# Patient Record
Sex: Male | Born: 1960 | Race: White | Hispanic: No | Marital: Married | Smoking: Never smoker
Health system: Southern US, Community
[De-identification: ages and names within clinical notes are randomized; demographics above are authoritative.]

## PROBLEM LIST (undated history)

## (undated) DIAGNOSIS — C801 Malignant (primary) neoplasm, unspecified: Secondary | ICD-10-CM

## (undated) DIAGNOSIS — F4322 Adjustment disorder with anxiety: Secondary | ICD-10-CM

## (undated) DIAGNOSIS — C9110 Chronic lymphocytic leukemia of B-cell type not having achieved remission: Secondary | ICD-10-CM

## (undated) DIAGNOSIS — E785 Hyperlipidemia, unspecified: Secondary | ICD-10-CM

## (undated) HISTORY — DX: Malignant (primary) neoplasm, unspecified: C80.1

## (undated) HISTORY — PX: BASAL CELL CARCINOMA EXCISION: SHX1214

## (undated) HISTORY — DX: Chronic lymphocytic leukemia of B-cell type not having achieved remission: C91.10

## (undated) HISTORY — PX: LASIK: SHX215

## (undated) HISTORY — DX: Hyperlipidemia, unspecified: E78.5

## (undated) HISTORY — PX: HERNIA REPAIR: SHX51

## (undated) HISTORY — DX: Adjustment disorder with anxiety: F43.22

## (undated) HISTORY — PX: KNEE SURGERY: SHX244

---

## 2005-02-17 ENCOUNTER — Ambulatory Visit: Payer: Self-pay | Admitting: Family Medicine

## 2005-03-06 ENCOUNTER — Ambulatory Visit: Payer: Self-pay | Admitting: Family Medicine

## 2005-03-11 ENCOUNTER — Encounter: Admission: RE | Admit: 2005-03-11 | Discharge: 2005-03-11 | Payer: Self-pay | Admitting: Family Medicine

## 2005-03-17 ENCOUNTER — Ambulatory Visit (HOSPITAL_COMMUNITY): Admission: RE | Admit: 2005-03-17 | Discharge: 2005-03-17 | Payer: Self-pay | Admitting: Family Medicine

## 2005-03-17 IMAGING — NM NM HEPATO W/GB/PHARM/[PERSON_NAME]
1 series · 6 of 6 positions shown · non-contrast
Comparison: None.

CLINICAL DATA: Abdominal pain.
 NUCLEAR MEDICINE HEPATOBILIARY SCAN WITH EJECTION FRACTION:
TECHNIQUE: Routine imaging out to one hour following 5 millicuries of technetium-Choletec.  1.95 micrograms of CCK used to stimulate the gallbladder.

[Series 1: he hepatobiliary · 3.22mm/px · 6 of 60 frames shown]
[frame 6/60]
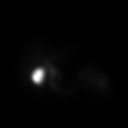
[frame 16/60]
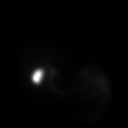
[frame 26/60]
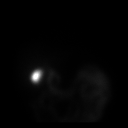
[frame 36/60]
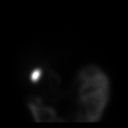
[frame 46/60]
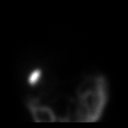
[frame 56/60]
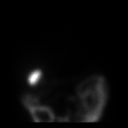

[6 of 6 positions shown; findings below may reference images not displayed]

FINDINGS: There is prompt visualization of the gallbladder, biliary tree, and small bowel.
 The ejection fraction is calculated at 61 percent at 30 minutes, which is normal.
IMPRESSION: 1.  Patent cystic and common bile ducts.
 2.  Normal ejection fraction.

## 2005-03-18 ENCOUNTER — Ambulatory Visit: Payer: Self-pay | Admitting: Family Medicine

## 2005-03-20 ENCOUNTER — Ambulatory Visit: Payer: Self-pay | Admitting: Family Medicine

## 2006-08-18 ENCOUNTER — Ambulatory Visit: Payer: Self-pay | Admitting: Family Medicine

## 2006-08-25 ENCOUNTER — Ambulatory Visit: Payer: Self-pay | Admitting: Family Medicine

## 2006-12-01 ENCOUNTER — Ambulatory Visit: Payer: Self-pay | Admitting: Family Medicine

## 2006-12-01 LAB — CONVERTED CEMR LAB
Chol/HDL Ratio, serum: 4.8
Cholesterol: 235 mg/dL (ref 0–200)
HDL: 48.8 mg/dL (ref >39.0)
Triglyceride fasting, serum: 46 mg/dL (ref 0–149)
VLDL: 9 mg/dL (ref 0–40)

## 2007-03-02 ENCOUNTER — Ambulatory Visit: Payer: Self-pay | Admitting: Family Medicine

## 2007-03-02 LAB — CONVERTED CEMR LAB
AST: 29 units/L (ref 0–37)
Albumin: 3.9 g/dL (ref 3.5–5.2)
Alkaline Phosphatase: 51 units/L (ref 39–117)
Total Bilirubin: 0.9 mg/dL (ref 0.3–1.2)
Total Protein: 6.1 g/dL (ref 6.0–8.3)
VLDL: 9 mg/dL (ref 0–40)

## 2007-08-20 ENCOUNTER — Ambulatory Visit: Payer: Self-pay | Admitting: Family Medicine

## 2007-08-20 DIAGNOSIS — F4322 Adjustment disorder with anxiety: Secondary | ICD-10-CM

## 2007-08-20 DIAGNOSIS — E785 Hyperlipidemia, unspecified: Secondary | ICD-10-CM

## 2007-08-20 HISTORY — DX: Adjustment disorder with anxiety: F43.22

## 2007-08-20 HISTORY — DX: Hyperlipidemia, unspecified: E78.5

## 2007-09-22 ENCOUNTER — Ambulatory Visit: Payer: Self-pay | Admitting: Family Medicine

## 2007-09-22 LAB — CONVERTED CEMR LAB
Albumin: 4.1 g/dL (ref 3.5–5.2)
BUN: 12 mg/dL (ref 6–23)
Basophils Relative: 0.5 % (ref 0.0–1.0)
Bilirubin, Direct: 0.2 mg/dL (ref 0.0–0.3)
CO2: 29 meq/L (ref 19–32)
Calcium: 9.4 mg/dL (ref 8.4–10.5)
Creatinine, Ser: 1.2 mg/dL (ref 0.4–1.5)
Eosinophils Absolute: 0.2 10*3/uL (ref 0.0–0.6)
Eosinophils Relative: 3.6 % (ref 0.0–5.0)
HCT: 45.2 % (ref 39.0–52.0)
HDL: 50.5 mg/dL (ref >39.0)
Hemoglobin: 15.6 g/dL (ref 13.0–17.0)
MCV: 89.2 fL (ref 78.0–100.0)
Sodium: 139 meq/L (ref 135–145)
TSH: 2.84 microintl units/mL (ref 0.35–5.50)
Total Bilirubin: 1 mg/dL (ref 0.3–1.2)
Total Protein: 6.3 g/dL (ref 6.0–8.3)
Triglycerides: 49 mg/dL (ref 0–149)
VLDL: 10 mg/dL (ref 0–40)
WBC: 5.5 10*3/uL (ref 4.5–10.5)

## 2007-10-04 ENCOUNTER — Ambulatory Visit: Payer: Self-pay | Admitting: Family Medicine

## 2008-10-11 ENCOUNTER — Ambulatory Visit: Payer: Self-pay | Admitting: Family Medicine

## 2008-10-11 LAB — CONVERTED CEMR LAB
ALT: 29 units/L (ref 0–53)
AST: 23 units/L (ref 0–37)
Albumin: 3.9 g/dL (ref 3.5–5.2)
Basophils Relative: 0.6 % (ref 0.0–3.0)
Chloride: 104 meq/L (ref 96–112)
Creatinine, Ser: 1.1 mg/dL (ref 0.4–1.5)
Eosinophils Absolute: 0.2 10*3/uL (ref 0.0–0.7)
GFR calc Af Amer: 93 mL/min
Hemoglobin: 15.5 g/dL (ref 13.0–17.0)
Lymphocytes Relative: 31.8 % (ref 12.0–46.0)
MCHC: 34.9 g/dL (ref 30.0–36.0)
MCV: 88.6 fL (ref 78.0–100.0)
Monocytes Absolute: 0.3 10*3/uL (ref 0.1–1.0)
Potassium: 4.4 meq/L (ref 3.5–5.1)
RBC: 5.02 M/uL (ref 4.22–5.81)
Sodium: 141 meq/L (ref 135–145)
Total Protein: 6.5 g/dL (ref 6.0–8.3)
Triglycerides: 55 mg/dL (ref 0–149)

## 2008-10-30 ENCOUNTER — Ambulatory Visit: Payer: Self-pay | Admitting: Family Medicine

## 2009-11-28 ENCOUNTER — Ambulatory Visit: Payer: Self-pay | Admitting: Family Medicine

## 2009-11-28 LAB — CONVERTED CEMR LAB
ALT: 29 units/L (ref 0–53)
Albumin: 3.9 g/dL (ref 3.5–5.2)
Alkaline Phosphatase: 39 units/L (ref 39–117)
Basophils Relative: 0.5 % (ref 0.0–3.0)
Bilirubin Urine: NEGATIVE
Blood in Urine, dipstick: NEGATIVE
Calcium: 9.5 mg/dL (ref 8.4–10.5)
Cholesterol: 263 mg/dL — ABNORMAL HIGH (ref 0–200)
Creatinine, Ser: 1.1 mg/dL (ref 0.4–1.5)
Eosinophils Absolute: 0.2 10*3/uL (ref 0.0–0.7)
Glucose, Bld: 109 mg/dL — ABNORMAL HIGH (ref 70–99)
Glucose, Urine, Semiquant: NEGATIVE
Hemoglobin: 15.2 g/dL (ref 13.0–17.0)
Ketones, urine, test strip: NEGATIVE
Lymphocytes Relative: 33.9 % (ref 12.0–46.0)
Lymphs Abs: 1.9 10*3/uL (ref 0.7–4.0)
Monocytes Relative: 4.7 % (ref 3.0–12.0)
Nitrite: NEGATIVE
Protein, U semiquant: NEGATIVE
RBC: 5.09 M/uL (ref 4.22–5.81)
RDW: 13.2 % (ref 11.5–14.6)
Specific Gravity, Urine: 1.02
TSH: 1.6 microintl units/mL (ref 0.35–5.50)
Total Bilirubin: 1.2 mg/dL (ref 0.3–1.2)
Total CHOL/HDL Ratio: 5
Urobilinogen, UA: 0.2
WBC Urine, dipstick: NEGATIVE
WBC: 5.5 10*3/uL (ref 4.5–10.5)
pH: 5.5

## 2009-12-06 ENCOUNTER — Ambulatory Visit: Payer: Self-pay | Admitting: Family Medicine

## 2010-01-28 ENCOUNTER — Encounter (INDEPENDENT_AMBULATORY_CARE_PROVIDER_SITE_OTHER): Payer: Self-pay | Admitting: *Deleted

## 2010-01-30 ENCOUNTER — Ambulatory Visit: Payer: Self-pay | Admitting: Internal Medicine

## 2010-02-28 ENCOUNTER — Ambulatory Visit: Payer: Self-pay | Admitting: Internal Medicine

## 2010-12-24 NOTE — Assessment & Plan Note (Signed)
Summary: cpx/cjr   Vital Signs:  Patient profile:   50 year old male Height:      73.75 inches Weight:      231 pounds BMI:     29.97 Pulse rate:   88 / minute BP sitting:   122 / 74  (left arm)  Vitals Entered By: Doristine Devoid (December 06, 2009 4:04 PM) CC: CPX   CC:  CPX.  History of Present Illness: Jesse [name] is a 50 year old, married male, nonsmoker, who comes in today for evaluation of depression, hyperlipidemia.  His always been in excellent health.  He said no chronic health problems except for the above.  He takes Celexa 20 mg nightly for mild anxiety sleeping well functioning well.  No side effects from medication wishes to continue.  He takes simvastatin 20 mg nightly along with an aspirin tablet total cholesterol 263, HDL 55, LDL 195.  Triglycerides 62, a week prior to his lab work.  He ran out of his medicine for one week.  He gets routine eye care every two years.  He has had lasik eye surgery.  He gets routine dental care.  His mother had a history of colon polyps.  His grandmother had a history of colon cancer.  Will recommend a colonoscopy at age 59.  Allergies: No Known Drug Allergies  Past History:  Past medical, surgical, family and social histories (including risk factors) reviewed, and no changes noted (except as noted below).  Past Medical History: Reviewed history from 10/04/2007 and no changes required. Hyperlipidemia Lasix eye surgery.  Successful 2008 no complications right inguinal hernia repair with mesh  Family History: Reviewed history from 10/04/2007 and no changes required. Family History of Alcoholism/Addiction Family History Breast cancer 1st degree relative <50 Family History Depression Sister bipolar depression  mother has colon polyps, grandmother colon cancer  Social History: Reviewed history from 10/04/2007 and no changes required. Occupation: Married Never Smoked Alcohol use-no Drug use-no Regular exercise-yes  Review of  Systems      See HPI  Physical Exam  General:  Well-developed,well-nourished,in no acute distress; alert,appropriate and cooperative throughout examination Head:  Normocephalic and atraumatic without obvious abnormalities. No apparent alopecia or balding. Eyes:  No corneal or conjunctival inflammation noted. EOMI. Perrla. Funduscopic exam benign, without hemorrhages, exudates or papilledema. Vision grossly normal. Ears:  External ear exam shows no significant lesions or deformities.  Otoscopic examination reveals clear canals, tympanic membranes are intact bilaterally without bulging, retraction, inflammation or discharge. Hearing is grossly normal bilaterally. Nose:  External nasal examination shows no deformity or inflammation. Nasal mucosa are pink and moist without lesions or exudates. Mouth:  Oral mucosa and oropharynx without lesions or exudates.  Teeth in good repair. Neck:  No deformities, masses, or tenderness noted. Chest Wall:  No deformities, masses, tenderness or gynecomastia noted. Breasts:  No masses or gynecomastia noted Lungs:  Normal respiratory effort, chest expands symmetrically. Lungs are clear to auscultation, no crackles or wheezes. Heart:  Normal rate and regular rhythm. S1 and S2 normal without gallop, murmur, click, rub or other extra sounds. Abdomen:  Bowel sounds positive,abdomen soft and non-tender without masses, organomegaly or hernias noted. Rectal:  No external abnormalities noted. Normal sphincter tone. No rectal masses or tenderness. Genitalia:  Testes bilaterally descended without nodularity, tenderness or masses. No scrotal masses or lesions. No penis lesions or urethral discharge. Prostate:  Prostate gland firm and smooth, no enlargement, nodularity, tenderness, mass, asymmetry or induration. Msk:  No deformity or scoliosis noted of thoracic or  lumbar spine.   Pulses:  R and L carotid,radial,femoral,dorsalis pedis and posterior tibial pulses are full and  equal bilaterally Extremities:  No clubbing, cyanosis, edema, or deformity noted with normal full range of motion of all joints.   Neurologic:  No cranial nerve deficits noted. Station and gait are normal. Plantar reflexes are down-going bilaterally. DTRs are symmetrical throughout. Sensory, motor and coordinative functions appear intact. Skin:  Intact without suspicious lesions or rashes Cervical Nodes:  No lymphadenopathy noted Axillary Nodes:  No palpable lymphadenopathy Inguinal Nodes:  No significant adenopathy Psych:  Cognition and judgment appear intact. Alert and cooperative with normal attention span and concentration. 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 At bedtime  Orders: Prescription Created Electronically [id number])  Problem # 3:  Preventive Health Care (ICD-V70.0) Assessment: Unchanged  Complete Medication List: 1)  Simvastatin 20 Mg Tabs (Simvastatin) .... At bedtime 2)  Celexa 20 Mg Tabs (Citalopram hydrobromide) .Marland Kitchen.. 1 tab @ bedtime 3)  Asa   Other Orders: EKG w/ Interpretation (93000) Gastroenterology Referral (GI)  Patient Instructions: 1)  Please schedule a follow-up appointment in 1 year. 2)  It is important that you exercise regularly at least 20 minutes 5 times a week. If you develop chest pain, have severe difficulty breathing, or feel very tired , stop exercising immediately and seek medical attention. 3)  Schedule a colonoscopy/sigmoidoscopy to help detect colon cancer. 4)  Take an Aspirin every day. Prescriptions: CELEXA 20 MG  TABS (CITALOPRAM HYDROBROMIDE) 1 tab @ bedtime  #100 x 3   Entered and  Authorized by:   Roderick Pee MD   Signed by:   Roderick Pee MD on 12/06/2009   Method used:   [facility]        [facility] [address] [phone]* (retail)       [address]       [place], [place]  [address]       Ph: [phone] or [phone]       Fax: [phone]   RxID:   [id number] SIMVASTATIN 20 MG  TABS (SIMVASTATIN) at bedtime  #100 x 3   Entered and Authorized by:   Roderick Pee MD   Signed by:   Roderick Pee MD on 12/06/2009   Method used:   [facility]ron[facility] to        [facility] [address] [phone]* (retail)       [address]       [place], [place]  [address]       Ph: [phone] or [phone]       Fax: [phone]   RxID:   [id number]

## 2010-12-24 NOTE — Miscellaneous (Signed)
Summary: [facility] Previsit/prep  Clinical Lists Changes  Medications: Added new medication of MIRALAX   POWD (POLYETHYLENE GLYCOL 3350) As per prep  instructions. - Signed Added new medication of METOCLOPRAMIDE HCL 10 MG  TABS (METOCLOPRAMIDE HCL) As per prep instructions. - Signed Added new medication of DULCOLAX 5 MG  TBEC (BISACODYL) Day before procedure take 2 at 3pm and 2 at 8pm. - Signed Rx of MIRALAX   POWD (POLYETHYLENE GLYCOL 3350) As per prep  instructions.;  #255gm x 0;  Signed;  Entered by: [name] RN;  Authorized by: Hart Carwin MD;  Method used: [facility]ically to [facility] [address] [phone]*, [address], [place], [place], [place]  [address], Ph: [phone] or [phone], Fax: [phone] Rx of METOCLOPRAMIDE HCL 10 MG  TABS (METOCLOPRAMIDE HCL) As per prep instructions.;  #2 x 0;  Signed;  Entered by: [name] RN;  Authorized by: Hart Carwin MD;  Method used: Electronically to [facility] [address] [phone]*, [address] [place], [place], [place]  [address], Ph: [phone] or [phone], Fax: [phone] Rx of DULCOLAX 5 MG  TBEC (BISACODYL) Day before procedure take 2 at 3pm and 2 at 8pm.;  #4 x 0;  Signed;  Entered by: [name] RN;  Authorized by: Hart Carwin MD;  Method used: Electronically to [facility] [address] [phone]*, [address] [place], [place], [place]  [address], Ph: [phone] or [phone], Fax: [phone] Observations: Added new observation of NKA: T (01/30/2010 10:35)    Prescriptions: DULCOLAX 5 MG  TBEC (BISACODYL) Day before procedure take 2 at 3pm and 2 at 8pm.  #4 x 0   Entered by:   [name] RN   Authorized by:   Hart Carwin MD   Signed by:   [name] RN on 01/30/2010   Method used:   Electronically to        [facility] [address] [phone]* (retail)       [address]       [place], [place]  [address]       Ph: [phone] or [phone]       Fax:  [phone]   RxID:   [id number] METOCLOPRAMIDE HCL 10 MG  TABS (METOCLOPRAMIDE HCL) As per prep instructions.  #2 x 0   Entered by:   [name] RN   Authorized by:   Hart Carwin MD   Signed by:   [name] RN on 01/30/2010   Method used:   Electronically to        [facility] [address] [phone]* (retail)       [address]       [place], [place]  [address]       Ph: [phone] or [phone]       Fax: [phone]   RxID:   [id number] MIRALAX   POWD (POLYETHYLENE GLYCOL 3350) As per prep  instructions.  #255gm x 0   Entered by:   [name] RN   Authorized by:   Hart Carwin MD   Signed by:   [name] RN on 01/30/2010   Method used:   Electronically to        [facility] [address] [phone]* (retail)       [address]       [place], [place]  [address]       Ph: [phone] or [phone]       Fax: [phone]   RxID:   [id number]

## 2010-12-24 NOTE — Procedures (Signed)
Summary: Colonoscopy  Patient: [name] Note: All result statuses are Final unless otherwise noted.  Tests: (1) Colonoscopy (COL)   COL Colonoscopy           DONE     [facility]     [address]     [place], [place]  [address]           COLONOSCOPY PROCEDURE REPORT           PATIENT:  [name], Jesse  MR#:  [id number]     BIRTHDATE:  1961-08-24, 49 yrs. old  GENDER:  male     ENDOSCOPIST:  Hedwig Morton. Juanda Chance, MD     REF. BY:  Tinnie Gens A. Tawanna Cooler, M.D.     PROCEDURE DATE:  02/28/2010     PROCEDURE:  Colonoscopy [address]     ASA CLASS:  Class I     INDICATIONS:  Routine Risk Screening grandfather with colon cancer           parent with colon polyps     MEDICATIONS:   Versed 7 mg, Fentanyl 75 mcg           DESCRIPTION OF PROCEDURE:   After the risks benefits and     alternatives of the procedure were thoroughly explained, informed     consent was obtained.  Digital rectal exam was performed and     revealed no rectal masses.   The LB [id number] endoscope     was introduced through the anus and advanced to the cecum, which     was identified by both the appendix and ileocecal valve, without     limitations.  The quality of the prep was good, using MiraLax.     The instrument was then slowly withdrawn as the colon was fully     examined.     <<PROCEDUREIMAGES>>           FINDINGS:  Mild diverticulosis was found in the sigmoid colon (see     image1).  This was otherwise a normal examination of the colon     (see image5, image4, image3, and image2).  Internal hemorrhoids     were found (see image7 and image6).   Retroflexed views in the     rectum revealed no abnormalities.    The scope was then withdrawn     from the patient and the procedure completed.           COMPLICATIONS:  None     ENDOSCOPIC IMPRESSION:     1) Mild diverticulosis in the sigmoid colon     2) Otherwise normal examination     3) Internal hemorrhoids     RECOMMENDATIONS:     1) high fiber diet     REPEAT EXAM:  In 7 - 10 year(s) for.           ______________________________     Hedwig Morton. Juanda Chance, MD           CC:           n.     eSIGNED:   Hedwig Morton. Brodie at 02/28/2010 08:51 AM           Jesse [name], [phone]  Note: An exclamation mark (!) indicates a result that was not dispersed into the flowsheet. Document Creation Date: 02/28/2010 8:52 AM _______________________________________________________________________  (1) Order result status: Final Collection or observation date-time: 02/28/2010 08:45 Requested date-time:  Receipt date-time:  Reported date-time:  Referring Physician:   Ordering Physician:  Lina Sar ([id number]) Specimen Source:  Source: Launa Grill Order Number: [id number] Lab site:   Appended Document: Colonoscopy    Clinical Lists Changes  Observations: Added new observation of COLONNXTDUE: 02/2017 (02/28/2010 13:29)

## 2010-12-24 NOTE — Letter (Signed)
Summary: [name] Instructions  [facility]  [address] [place], [place] [address]   Phone: [phone]  Fax: [phone]       Jesse [name]    02-04-1961    MRN: [id number]       Procedure Day Jesse [name]:  [facility]  02/20/10     Arrival Time: 8:00AM     Procedure Time: 9:00AM     Location of Procedure:                    Jesse [name] _  [facility] (4th Floor)     PREPARATION FOR COLONOSCOPY WITH MIRALAX  Starting 5 days prior to your procedure 02/15/10 do not eat nuts, seeds, popcorn, corn, beans, peas,  salads, or any raw vegetables.  Do not take any fiber supplements (e.g. Metamucil, Citrucel, and Benefiber). ____________________________________________________________________________________________________   THE DAY BEFORE YOUR PROCEDURE         DATE: 02/19/10 DAY: TUESDAY  1   Drink clear liquids the entire day-NO SOLID FOOD  2   Do not drink anything colored red or purple.  Avoid juices with pulp.  No orange juice.  3   Drink at least 64 oz. (8 glasses) of fluid/clear liquids during the day to prevent dehydration and help the prep work efficiently.  CLEAR LIQUIDS INCLUDE: Water Jello Ice Popsicles Tea (sugar ok, no milk/cream) Powdered fruit flavored drinks Coffee (sugar ok, no milk/cream) Gatorade Juice: apple, white grape, white cranberry  Lemonade Clear bullion, consomm, broth Carbonated beverages (any kind) Strained chicken noodle soup Hard Candy  4   Mix the entire bottle of Miralax with 64 oz. of Gatorade/Powerade in the morning and put in the refrigerator to chill.  5   At 3:00 pm take 2 Dulcolax/Bisacodyl tablets.  6   At 4:30 pm take one Reglan/Metoclopramide tablet.  7  Starting at 5:00 pm drink one 8 oz glass of the Miralax mixture every 15-20 minutes until you have finished drinking the entire 64 oz.  You should finish drinking prep around 7:30 or 8:00 pm.  8   If you are nauseated, you may take the 2nd Reglan/Metoclopramide  tablet at 6:30 pm.        9    At 8:00 pm take 2 more DULCOLAX/Bisacodyl tablets.     THE DAY OF YOUR PROCEDURE      DATE:  02/20/10 DAY: Jesse [name]  You may drink clear liquids until 7:00AM (2 HOURS BEFORE PROCEDURE).   MEDICATION INSTRUCTIONS  Unless otherwise instructed, you should take regular prescription medications with a small sip of water as early as possible the morning of your procedure.            OTHER INSTRUCTIONS  You will need a responsible adult at least 48 years of age to accompany you and drive you home.   This person must remain in the waiting room during your procedure.  Wear loose fitting clothing that is easily removed.  Leave jewelry and other valuables at home.  However, you may wish to bring a book to read or an iPod/MP3 player to listen to music as you wait for your procedure to start.  Remove all body piercing jewelry and leave at home.  Total time from sign-in until discharge is approximately 2-3 hours.  You should go home directly after your procedure and rest.  You can resume normal activities the day after your procedure.  The day of your procedure you should not:   Drive  Make legal decisions   Operate machinery   Drink alcohol   Return to work  You will receive specific instructions about eating, activities and medications before you leave.   The above instructions have been reviewed and explained to me by  Jesse Almas RN  January 30, 2010 10:59 AM     I fully understand and can verbalize these instructions _____________________________ Date _______

## 2011-02-25 ENCOUNTER — Other Ambulatory Visit: Payer: Self-pay | Admitting: Family Medicine

## 2011-06-09 ENCOUNTER — Other Ambulatory Visit: Payer: Self-pay | Admitting: Family Medicine

## 2011-07-10 ENCOUNTER — Other Ambulatory Visit: Payer: Self-pay | Admitting: Family Medicine

## 2011-11-30 ENCOUNTER — Other Ambulatory Visit: Payer: Self-pay | Admitting: Family Medicine

## 2011-12-04 ENCOUNTER — Other Ambulatory Visit (INDEPENDENT_AMBULATORY_CARE_PROVIDER_SITE_OTHER): Payer: 59

## 2011-12-04 DIAGNOSIS — Z0000 Encounter for general adult medical examination without abnormal findings: Secondary | ICD-10-CM

## 2011-12-04 LAB — POCT URINALYSIS DIPSTICK
Bilirubin, UA: NEGATIVE
Glucose, UA: NEGATIVE
Ketones, UA: NEGATIVE
Protein, UA: NEGATIVE
Spec Grav, UA: 1.025
Urobilinogen, UA: 0.2
pH, UA: 5

## 2011-12-04 LAB — CBC WITH DIFFERENTIAL/PLATELET
Basophils Absolute: 0 10*3/uL (ref 0.0–0.1)
Basophils Relative: 0.5 % (ref 0.0–3.0)
Eosinophils Absolute: 0.2 10*3/uL (ref 0.0–0.7)
HCT: 43.3 % (ref 39.0–52.0)
Hemoglobin: 14.8 g/dL (ref 13.0–17.0)
Lymphocytes Relative: 34.2 % (ref 12.0–46.0)
MCHC: 34.3 g/dL (ref 30.0–36.0)
MCV: 88.9 fl (ref 78.0–100.0)
Neutrophils Relative %: 56.3 % (ref 43.0–77.0)
Platelets: 184 10*3/uL (ref 150.0–400.0)
RDW: 13.6 % (ref 11.5–14.6)
WBC: 6.9 10*3/uL (ref 4.5–10.5)

## 2011-12-04 LAB — TSH: TSH: 2.87 u[IU]/mL (ref 0.35–5.50)

## 2011-12-04 LAB — BASIC METABOLIC PANEL: Creatinine, Ser: 1.2 mg/dL (ref 0.4–1.5)

## 2011-12-04 LAB — HEPATIC FUNCTION PANEL
Alkaline Phosphatase: 54 U/L (ref 39–117)
Total Bilirubin: 0.7 mg/dL (ref 0.3–1.2)
Total Protein: 6.6 g/dL (ref 6.0–8.3)

## 2011-12-12 ENCOUNTER — Encounter: Payer: Self-pay | Admitting: Family Medicine

## 2011-12-15 ENCOUNTER — Encounter: Payer: Self-pay | Admitting: Family Medicine

## 2011-12-15 ENCOUNTER — Ambulatory Visit (INDEPENDENT_AMBULATORY_CARE_PROVIDER_SITE_OTHER): Payer: 59 | Admitting: Family Medicine

## 2011-12-15 DIAGNOSIS — F4322 Adjustment disorder with anxiety: Secondary | ICD-10-CM

## 2011-12-15 DIAGNOSIS — Z23 Encounter for immunization: Secondary | ICD-10-CM

## 2011-12-15 DIAGNOSIS — E785 Hyperlipidemia, unspecified: Secondary | ICD-10-CM

## 2011-12-15 MED ORDER — CITALOPRAM HYDROBROMIDE 20 MG PO TABS: 20.0000 mg | ORAL_TABLET | ORAL | Status: DC

## 2011-12-15 MED ORDER — SIMVASTATIN 20 MG PO TABS: 20.0000 mg | ORAL_TABLET | Freq: Every day | ORAL | Status: DC

## 2011-12-15 NOTE — Progress Notes (Signed)
  Subjective:    Patient ID: Jesse [name], male    DOB: 07/01/1961, 51 y.o.   MRN: [id number]  HPI Jesse [name] is a 51 year old, married male, nonsmoker, who comes in today for evaluation of mild anxiety, and hyperlipidemia.  He takes Celexa 20 mg a day at bedtime for mild anxiety.  Also, on aspirin tablet and simvastatin 20 mg nightly for hyperlipidemia.  The children are 8..9 and 10 at home.  He also has his father is 57 years old now living with him  He has two lesions, one on the right side of his nose.  Another on the left cheek that are increasing in size   Review of Systems  Constitutional: Negative.   HENT: Negative.   Eyes: Negative.   Respiratory: Negative.   Cardiovascular: Negative.   Gastrointestinal: Negative.   Genitourinary: Negative.   Musculoskeletal: Negative.   Skin: Negative.   Neurological: Negative.   Hematological: Negative.   Psychiatric/Behavioral: Negative.        Objective:   Physical Exam  Constitutional: He is oriented to person, place, and time. He appears well-developed and well-nourished.  HENT:  Head: Normocephalic and atraumatic.  Right Ear: External ear normal.  Left Ear: External ear normal.  Nose: Nose normal.  Mouth/Throat: Oropharynx is clear and moist.  Eyes: Conjunctivae and EOM are normal. Pupils are equal, round, and reactive to light.  Neck: Normal range of motion. Neck supple. No JVD present. No tracheal deviation present. No thyromegaly present.  Cardiovascular: Normal rate, regular rhythm, normal heart sounds and intact distal pulses.  Exam reveals no gallop and no friction rub.   No murmur heard. Pulmonary/Chest: Effort normal and breath sounds normal. No stridor. No respiratory distress. He has no wheezes. He has no rales. He exhibits no tenderness.  Abdominal: Soft. Bowel sounds are normal. He exhibits no distension and no mass. There is no tenderness. There is no rebound and no guarding.  Genitourinary: Rectum normal, prostate  normal and penis normal. Guaiac negative stool. No penile tenderness.  Musculoskeletal: Normal range of motion. He exhibits no edema and no tenderness.  Lymphadenopathy:    He has no cervical adenopathy.  Neurological: He is alert and oriented to person, place, and time. He has normal reflexes. No cranial nerve deficit. He exhibits normal muscle tone.  Skin: Skin is warm and dry. No rash noted. No erythema. No pallor.       Total body skin exam normal except for a elevated lesion right side of the nose, and a flat dark lesion, left cheek advised to return for removal  Psychiatric: He has a normal mood and affect. His behavior is normal. Judgment and thought content normal.          Assessment & Plan:  Healthy male.  Mild anxiety.  Continue Celexa 20 mg nightly  Hyperlipidemia.  Continue Zocor 20 mg daily and an aspirin tablet.  Two abnormal appearing moles.  Return for removal

## 2011-12-15 NOTE — Patient Instructions (Signed)
Continue your current medications.  Set up a 30 minute appointment sometime in the next couple weeks to remove the two lesions we discussed

## 2011-12-31 ENCOUNTER — Ambulatory Visit: Payer: 59 | Admitting: Family Medicine

## 2012-01-06 ENCOUNTER — Ambulatory Visit: Payer: 59 | Admitting: Family Medicine

## 2012-01-15 ENCOUNTER — Encounter: Payer: Self-pay | Admitting: Family Medicine

## 2012-01-15 ENCOUNTER — Ambulatory Visit (INDEPENDENT_AMBULATORY_CARE_PROVIDER_SITE_OTHER): Payer: 59 | Admitting: Family Medicine

## 2012-01-15 DIAGNOSIS — C44319 Basal cell carcinoma of skin of other parts of face: Secondary | ICD-10-CM

## 2012-01-15 DIAGNOSIS — L989 Disorder of the skin and subcutaneous tissue, unspecified: Secondary | ICD-10-CM

## 2012-01-15 DIAGNOSIS — D2330 Other benign neoplasm of skin of unspecified part of face: Secondary | ICD-10-CM

## 2012-01-15 DIAGNOSIS — C44310 Basal cell carcinoma of skin of unspecified parts of face: Secondary | ICD-10-CM | POA: Insufficient documentation

## 2012-01-15 DIAGNOSIS — D235 Other benign neoplasm of skin of trunk: Secondary | ICD-10-CM

## 2012-01-15 DIAGNOSIS — D225 Melanocytic nevi of trunk: Secondary | ICD-10-CM

## 2012-01-15 DIAGNOSIS — L820 Inflamed seborrheic keratosis: Secondary | ICD-10-CM | POA: Insufficient documentation

## 2012-01-15 DIAGNOSIS — D2230 Melanocytic nevi of unspecified part of face: Secondary | ICD-10-CM

## 2012-01-15 NOTE — Patient Instructions (Signed)
We will call you within 2 weeks with the path report 

## 2012-01-15 NOTE — Progress Notes (Signed)
  Subjective:    Patient ID: Jesse [name], male    DOB: [birth year], 51 y.o.   MRN: [id number]  HPI Jesse [name] is a 51 year old married male nonsmoker who comes in today for removal of 3 lesions  Lesion #1,,,,,,,, left face 10 mm x 10 mm appears to be an inflamed seborrheic keratosis  Lesion #2,,,,,,,,, 5 mm x 5 mm in the nasal fold,,,,,,,,,, question DCE  Lesion #3,,,,,,,, 5 mm x 5 mm posterior back midline T8....... Question bce   Review of Systems    review of systems negative except she has light skin and light eyes and has a lot of sun damage in the past Objective:   Physical Exam Physical examination as above  Procedure,,,,,,,, after informed consent all 3 lesions were anesthetized with 1% Xylocaine with epinephrine. They were excised with 2 mm margins. The bases were cauterized Band-Aids were applied. The lesions were sent for pathologic analysis. Dry sterile dressings were applied. He tolerated the procedure no complication      Assessment & Plan:  Inflamed seborrheic keratosis left face  Lesion in the fold of the right nose question basal cell carcinoma  Lesion midline back question basal cell carcinoma

## 2012-01-19 DIAGNOSIS — D2230 Melanocytic nevi of unspecified part of face: Secondary | ICD-10-CM | POA: Insufficient documentation

## 2012-01-19 DIAGNOSIS — D225 Melanocytic nevi of trunk: Secondary | ICD-10-CM | POA: Insufficient documentation

## 2012-01-21 ENCOUNTER — Telehealth: Payer: Self-pay | Admitting: Family Medicine

## 2012-01-21 NOTE — Telephone Encounter (Signed)
Pt is calling back requesting biopsy results

## 2012-01-22 NOTE — Telephone Encounter (Signed)
Spoke with patient.

## 2012-08-17 ENCOUNTER — Other Ambulatory Visit: Payer: Self-pay | Admitting: Family Medicine

## 2012-08-30 ENCOUNTER — Other Ambulatory Visit: Payer: Self-pay | Admitting: Family Medicine

## 2012-09-21 ENCOUNTER — Other Ambulatory Visit: Payer: Self-pay | Admitting: Family Medicine

## 2012-12-08 ENCOUNTER — Other Ambulatory Visit (INDEPENDENT_AMBULATORY_CARE_PROVIDER_SITE_OTHER): Payer: 59

## 2012-12-08 DIAGNOSIS — Z0000 Encounter for general adult medical examination without abnormal findings: Secondary | ICD-10-CM

## 2012-12-08 LAB — BASIC METABOLIC PANEL
BUN: 20 mg/dL (ref 6–23)
CO2: 29 mEq/L (ref 19–32)
Calcium: 9.3 mg/dL (ref 8.4–10.5)
Chloride: 103 mEq/L (ref 96–112)
GFR: 65.6 mL/min (ref >60.00)
Glucose, Bld: 103 mg/dL — ABNORMAL HIGH (ref 70–99)

## 2012-12-08 LAB — LIPID PANEL
Cholesterol: 195 mg/dL (ref 0–200)
LDL Cholesterol: 131 mg/dL — ABNORMAL HIGH (ref 0–99)
Triglycerides: 71 mg/dL (ref 0.0–149.0)

## 2012-12-08 LAB — CBC WITH DIFFERENTIAL/PLATELET
Basophils Relative: 0.4 % (ref 0.0–3.0)
Lymphocytes Relative: 24.5 % (ref 12.0–46.0)
MCV: 86.4 fl (ref 78.0–100.0)
Monocytes Relative: 8.2 % (ref 3.0–12.0)
Neutro Abs: 4.7 10*3/uL (ref 1.4–7.7)
Neutrophils Relative %: 63.7 % (ref 43.0–77.0)
WBC: 7.4 10*3/uL (ref 4.5–10.5)

## 2012-12-08 LAB — POCT URINALYSIS DIPSTICK
Bilirubin, UA: NEGATIVE
Glucose, UA: NEGATIVE
Leukocytes, UA: NEGATIVE
Protein, UA: NEGATIVE
Spec Grav, UA: 1.025
Urobilinogen, UA: 0.2

## 2012-12-08 LAB — HEPATIC FUNCTION PANEL
Albumin: 4 g/dL (ref 3.5–5.2)
Bilirubin, Direct: 0.1 mg/dL (ref 0.0–0.3)
Total Protein: 6.4 g/dL (ref 6.0–8.3)

## 2012-12-08 LAB — PSA: PSA: 0.99 ng/mL (ref 0.10–4.00)

## 2012-12-08 LAB — TSH: TSH: 3.22 u[IU]/mL (ref 0.35–5.50)

## 2012-12-15 ENCOUNTER — Encounter: Payer: Self-pay | Admitting: Family Medicine

## 2012-12-15 ENCOUNTER — Ambulatory Visit (INDEPENDENT_AMBULATORY_CARE_PROVIDER_SITE_OTHER): Payer: 59 | Admitting: Family Medicine

## 2012-12-15 VITALS — BP 120/80 | Temp 98.1°F | Ht 74.0 in | Wt 234.0 lb

## 2012-12-15 DIAGNOSIS — F4322 Adjustment disorder with anxiety: Secondary | ICD-10-CM

## 2012-12-15 DIAGNOSIS — E785 Hyperlipidemia, unspecified: Secondary | ICD-10-CM

## 2012-12-15 NOTE — Patient Instructions (Signed)
Return sometime in the next month for removal of the lesions on your face  Continue your current medication  Followup in 1 year sooner if any problems

## 2012-12-15 NOTE — Progress Notes (Signed)
  Subjective:    Patient ID: Jesse [name], male    DOB: Sep 18, 1961, 52 y.o.   MRN: [id number]  HPI [name] is a 52 year old married male nonsmoker who comes in today for general physical examination because of a history of mild hyperlipidemia and a mild anxiety disorder  He takes Celexa 20 mg each bedtime and Zocor and an aspirin tablet daily.  He states he feels well and has no major complaints. 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  He has 2 lesions on his face one under his left nostril the other is on his chin that bleed when he had some with his razor. Advise return for removal   Review of Systems  Constitutional: Negative.   HENT: Negative.   Eyes: Negative.   Respiratory: Negative.   Cardiovascular: Negative.   Gastrointestinal: Negative.   Genitourinary: Negative.   Musculoskeletal: Negative.   Skin: Negative.   Neurological: Negative.   Hematological: Negative.   Psychiatric/Behavioral: Negative.        Objective:   Physical Exam  Constitutional: He is oriented to person, place, and time. He appears well-developed and well-nourished.  HENT:  Head: Normocephalic and atraumatic.  Right Ear: External ear normal.  Left Ear: External ear normal.  Nose: Nose normal.  Mouth/Throat: Oropharynx is clear and moist.  Eyes: Conjunctivae normal and EOM are normal. Pupils are equal, round, and reactive to light.  Neck: Normal range of motion. Neck supple. No JVD present. No tracheal deviation present. No thyromegaly present.  Cardiovascular: Normal rate, regular rhythm, normal heart sounds and intact distal pulses.  Exam reveals no gallop and no friction rub.   No murmur heard. Pulmonary/Chest: Effort normal and breath sounds normal. No stridor. No respiratory distress. He has  no wheezes. He has no rales. He exhibits no tenderness.  Abdominal: Soft. Bowel sounds are normal. He exhibits no distension and no mass. There is no tenderness. There is no rebound and no guarding.  Genitourinary: Rectum normal, prostate normal and penis normal. Guaiac negative stool. No penile tenderness.  Musculoskeletal: Normal range of motion. He exhibits no edema and no tenderness.  Lymphadenopathy:    He has no cervical adenopathy.  Neurological: He is alert and oriented to person, place, and time. He has normal reflexes. No cranial nerve deficit. He exhibits normal muscle tone.  Skin: Skin is warm and dry. No rash noted. No erythema. No pallor.       Total body skin exam normal except for 2 lesions  One is 4 mm x4 mm on the upper lip of the nostril  #2 is same size mid shin below the left side of the lip.  Psychiatric: He has a normal mood and affect. His behavior is normal. Judgment and thought content normal.          Assessment & Plan:  Healthy male  Hyperlipidemia continue Zocor and aspirin daily  History of mild anxiety continue Celexa 20 mg daily  2 abnormal lesions on the face return for removal

## 2012-12-23 ENCOUNTER — Ambulatory Visit: Payer: 59 | Admitting: Family Medicine

## 2012-12-30 ENCOUNTER — Ambulatory Visit: Payer: 59 | Admitting: Family Medicine

## 2013-01-09 ENCOUNTER — Other Ambulatory Visit: Payer: Self-pay | Admitting: Family Medicine

## 2013-01-10 ENCOUNTER — Encounter: Payer: Self-pay | Admitting: Family Medicine

## 2013-01-10 ENCOUNTER — Ambulatory Visit (INDEPENDENT_AMBULATORY_CARE_PROVIDER_SITE_OTHER): Payer: 59 | Admitting: Family Medicine

## 2013-01-10 DIAGNOSIS — D2230 Melanocytic nevi of unspecified part of face: Secondary | ICD-10-CM

## 2013-01-10 DIAGNOSIS — D2330 Other benign neoplasm of skin of unspecified part of face: Secondary | ICD-10-CM

## 2013-01-10 NOTE — Addendum Note (Signed)
Addended by: Kern Reap B on: 01/10/2013 10:49 AM   Modules accepted: Orders

## 2013-01-10 NOTE — Progress Notes (Signed)
  Subjective:    Patient ID: Jesse [name], male    DOB: [birth year], 52 y.o.   MRN: [id number]  HPI Jesse [name] is a 52 year old married male nonsmoker who comes in today for move of 2 lesions on his face  He's had a history of dysplastic nevus and a basal cell carcinoma removed in the past. His last lesion was at the angle of his right side of his nose it's healed very nicely no evidence of recurrence  Lesion #1 is a 6 mm x 6 mg lesion left upper lip  Lesion #2 4 mm x 4 mm left lower chin.  Both lesions or elevated white with no central cavitation however they're irritated and seem to be increasing in size therefore meet criteria for removal   Review of Systems    review of systems otherwise negative except for history of skin lesions as noted above Objective:   Physical Exam  Well-developed well-nourished male in no acute distress  After informed consent both lesions were anesthetized with 1% Xylocaine with epinephrine and removed with 2 mm margins bases were cauterized both lesions were sent for pathologic analysis. The patient tolerated procedure no complications. Clinically they appear to be dysplastic nevi rule out basal cell carcinoma      Assessment & Plan:  Dysplastic nevi x2 BCE

## 2013-01-10 NOTE — Patient Instructions (Signed)
Within 2 weeks we will call you the Path report

## 2013-04-18 ENCOUNTER — Other Ambulatory Visit: Payer: Self-pay | Admitting: Family Medicine

## 2014-01-24 ENCOUNTER — Other Ambulatory Visit: Payer: Self-pay | Admitting: Family Medicine

## 2014-02-27 ENCOUNTER — Other Ambulatory Visit: Payer: Self-pay | Admitting: Family Medicine

## 2014-03-15 ENCOUNTER — Other Ambulatory Visit: Payer: 59

## 2014-03-16 ENCOUNTER — Other Ambulatory Visit (INDEPENDENT_AMBULATORY_CARE_PROVIDER_SITE_OTHER): Payer: 59

## 2014-03-16 DIAGNOSIS — Z0000 Encounter for general adult medical examination without abnormal findings: Secondary | ICD-10-CM

## 2014-03-16 LAB — POCT URINALYSIS DIPSTICK
BILIRUBIN UA: NEGATIVE
Blood, UA: NEGATIVE
GLUCOSE UA: NEGATIVE
Ketones, UA: NEGATIVE
Leukocytes, UA: NEGATIVE
Nitrite, UA: NEGATIVE
PH UA: 5.5
Protein, UA: NEGATIVE
Spec Grav, UA: 1.025
Urobilinogen, UA: 0.2

## 2014-03-16 LAB — CBC WITH DIFFERENTIAL/PLATELET
BASOS PCT: 0.5 % (ref 0.0–3.0)
Basophils Absolute: 0 10*3/uL (ref 0.0–0.1)
EOS PCT: 2.7 % (ref 0.0–5.0)
Eosinophils Absolute: 0.2 10*3/uL (ref 0.0–0.7)
HEMATOCRIT: 43.8 % (ref 39.0–52.0)
HEMOGLOBIN: 14.9 g/dL (ref 13.0–17.0)
LYMPHS ABS: 3.2 10*3/uL (ref 0.7–4.0)
Lymphocytes Relative: 40.4 % (ref 12.0–46.0)
MCHC: 34.1 g/dL (ref 30.0–36.0)
MCV: 87.9 fl (ref 78.0–100.0)
MONOS PCT: 5.1 % (ref 3.0–12.0)
Monocytes Absolute: 0.4 10*3/uL (ref 0.1–1.0)
Neutro Abs: 4.1 10*3/uL (ref 1.4–7.7)
Neutrophils Relative %: 51.3 % (ref 43.0–77.0)
PLATELETS: 208 10*3/uL (ref 150.0–400.0)
RBC: 4.99 Mil/uL (ref 4.22–5.81)
RDW: 13.9 % (ref 11.5–14.6)
WBC: 8 10*3/uL (ref 4.5–10.5)

## 2014-03-16 LAB — LIPID PANEL
CHOL/HDL RATIO: 4
Cholesterol: 204 mg/dL — ABNORMAL HIGH (ref 0–200)
HDL: 49.5 mg/dL (ref >39.00)
LDL Cholesterol: 148 mg/dL — ABNORMAL HIGH (ref 0–99)
TRIGLYCERIDES: 34 mg/dL (ref 0.0–149.0)
VLDL: 6.8 mg/dL (ref 0.0–40.0)

## 2014-03-16 LAB — BASIC METABOLIC PANEL
BUN: 18 mg/dL (ref 6–23)
CO2: 30 mEq/L (ref 19–32)
CREATININE: 1.1 mg/dL (ref 0.4–1.5)
Calcium: 9.6 mg/dL (ref 8.4–10.5)
Chloride: 103 mEq/L (ref 96–112)
GFR: 77.37 mL/min (ref >60.00)
Glucose, Bld: 94 mg/dL (ref 70–99)
Potassium: 4.5 mEq/L (ref 3.5–5.1)
SODIUM: 139 meq/L (ref 135–145)

## 2014-03-16 LAB — PSA: PSA: 0.59 ng/mL (ref 0.10–4.00)

## 2014-03-16 LAB — HEPATIC FUNCTION PANEL
ALT: 34 U/L (ref 0–53)
AST: 24 U/L (ref 0–37)
Albumin: 4 g/dL (ref 3.5–5.2)
Alkaline Phosphatase: 56 U/L (ref 39–117)
BILIRUBIN TOTAL: 1 mg/dL (ref 0.3–1.2)
Bilirubin, Direct: 0.1 mg/dL (ref 0.0–0.3)
TOTAL PROTEIN: 6.3 g/dL (ref 6.0–8.3)

## 2014-03-16 LAB — TSH: TSH: 2.89 u[IU]/mL (ref 0.35–5.50)

## 2014-03-22 ENCOUNTER — Encounter: Payer: Self-pay | Admitting: Family Medicine

## 2014-03-22 ENCOUNTER — Ambulatory Visit (INDEPENDENT_AMBULATORY_CARE_PROVIDER_SITE_OTHER): Payer: 59 | Admitting: Family Medicine

## 2014-03-22 VITALS — BP 120/80 | Temp 98.6°F | Ht 74.0 in | Wt 240.0 lb

## 2014-03-22 DIAGNOSIS — F4322 Adjustment disorder with anxiety: Secondary | ICD-10-CM

## 2014-03-22 DIAGNOSIS — E785 Hyperlipidemia, unspecified: Secondary | ICD-10-CM

## 2014-03-22 MED ORDER — CITALOPRAM HYDROBROMIDE 20 MG PO TABS: ORAL_TABLET | ORAL | Status: DC

## 2014-03-22 MED ORDER — SIMVASTATIN 20 MG PO TABS: 20.0000 mg | ORAL_TABLET | Freq: Every day | ORAL | Status: DC

## 2014-03-22 NOTE — Progress Notes (Signed)
Pre visit review using our clinic review tool, if applicable. No additional management support is needed unless otherwise documented below in the visit note. 

## 2014-03-22 NOTE — Progress Notes (Signed)
   Subjective:    Patient ID: Jesse [name], male    DOB: 1961/09/16, 53 y.o.   MRN: [id number]  HPI Jesse [name] is a 53 year old married male nonsmoker who comes in today for general physical examination because of a history of hyperlipidemia. He's on Zocor and aspirin daily  He also has a history of mild depression he takes Celexa 20 mg a day bedtime  Does not get routine eye checks,,,,,,, referred to Dr. Bing Plume,,,,,,, does get regular dental care colonoscopy a couple years ago normal.  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   HENT: Negative.   Eyes: Negative.   Respiratory: Negative.   Cardiovascular: Negative.   Gastrointestinal: Negative.   Genitourinary: Negative.   Musculoskeletal: Negative.   Skin: Negative.   Neurological: Negative.   Psychiatric/Behavioral: Negative.        Objective:   Physical Exam  Nursing note and vitals reviewed. Constitutional: He is oriented to person, place, and time. He appears well-developed and well-nourished.  HENT:  Head: Normocephalic and atraumatic.  Right Ear: External ear normal.  Left Ear: External ear normal.  Nose: Nose normal.  Mouth/Throat: Oropharynx is clear and moist.  Eyes: Conjunctivae and EOM are normal. Pupils are equal, round, and reactive to light.  Neck: Normal range of motion. Neck supple. No JVD present. No tracheal deviation present. No thyromegaly present.  Cardiovascular: Normal rate, regular rhythm, normal heart sounds and intact distal pulses.  Exam reveals no gallop and no friction rub.   No murmur heard. No carotid no aortic bruits peripheral pulses 2+ and symmetrical  Pulmonary/Chest: Effort normal and breath sounds normal. No stridor. No respiratory distress. He has no wheezes. He has no rales. He exhibits no tenderness.  Abdominal: Soft. Bowel sounds are normal. He exhibits no distension  and no mass. There is no tenderness. There is no rebound and no guarding.  Genitourinary: Rectum normal, prostate normal and penis normal. Guaiac negative stool. No penile tenderness.  Musculoskeletal: Normal range of motion. He exhibits no edema and no tenderness.  Lymphadenopathy:    He has no cervical adenopathy.  Neurological: He is alert and oriented to person, place, and time. He has normal reflexes. No cranial nerve deficit. He exhibits normal muscle tone.  Skin: Skin is warm and dry. No rash noted. No erythema. No pallor.  Total body skin exam shows 4 lesions left anterior upper chest wall there were red and inflamed. He says he's been there for about 3 years since he had shingles  Psychiatric: He has a normal mood and affect. His behavior is normal. Judgment and thought content normal.          Assessment & Plan:  Healthy male  Hyperlipidemia continue Zocor and aspirin  History of mild depression continue Celexa  4 abnormal lesions anterior chest wall referred to Dr. Nena Polio dermatologist

## 2014-03-22 NOTE — Patient Instructions (Signed)
Continue your current medications  Consult with Dr. Nena Polio about the 3 lesions we discussed  Return in one year for general physical exam sooner if any problems

## 2014-07-15 ENCOUNTER — Encounter: Payer: Self-pay | Admitting: Internal Medicine

## 2015-03-26 ENCOUNTER — Other Ambulatory Visit: Payer: Self-pay | Admitting: Family Medicine

## 2015-10-03 ENCOUNTER — Other Ambulatory Visit (INDEPENDENT_AMBULATORY_CARE_PROVIDER_SITE_OTHER): Payer: Commercial Managed Care - HMO

## 2015-10-03 DIAGNOSIS — Z0000 Encounter for general adult medical examination without abnormal findings: Secondary | ICD-10-CM

## 2015-10-03 LAB — CBC WITH DIFFERENTIAL/PLATELET
Basophils Absolute: 0 10*3/uL (ref 0.0–0.1)
Basophils Relative: 0.5 % (ref 0.0–3.0)
EOS PCT: 3 % (ref 0.0–5.0)
Eosinophils Absolute: 0.2 10*3/uL (ref 0.0–0.7)
HEMATOCRIT: 46.2 % (ref 39.0–52.0)
HEMOGLOBIN: 15.6 g/dL (ref 13.0–17.0)
Lymphocytes Relative: 38.7 % (ref 12.0–46.0)
Lymphs Abs: 3.1 10*3/uL (ref 0.7–4.0)
MCHC: 33.8 g/dL (ref 30.0–36.0)
MCV: 87.8 fl (ref 78.0–100.0)
MONOS PCT: 5.3 % (ref 3.0–12.0)
Monocytes Absolute: 0.4 10*3/uL (ref 0.1–1.0)
Neutro Abs: 4.2 10*3/uL (ref 1.4–7.7)
Neutrophils Relative %: 52.5 % (ref 43.0–77.0)
Platelets: 183 10*3/uL (ref 150.0–400.0)
RBC: 5.25 Mil/uL (ref 4.22–5.81)
RDW: 13.7 % (ref 11.5–15.5)
WBC: 7.9 10*3/uL (ref 4.0–10.5)

## 2015-10-03 LAB — POCT URINALYSIS DIPSTICK
BILIRUBIN UA: NEGATIVE
GLUCOSE UA: NEGATIVE
KETONES UA: NEGATIVE
LEUKOCYTES UA: NEGATIVE
NITRITE UA: NEGATIVE
Protein, UA: NEGATIVE
RBC UA: NEGATIVE
Spec Grav, UA: 1.025
Urobilinogen, UA: 0.2
pH, UA: 5.5

## 2015-10-03 LAB — LIPID PANEL
CHOL/HDL RATIO: 4
Cholesterol: 196 mg/dL (ref 0–200)
HDL: 47.5 mg/dL (ref >39.00)
LDL CALC: 135 mg/dL — AB (ref 0–99)
NONHDL: 148.83
TRIGLYCERIDES: 69 mg/dL (ref 0.0–149.0)
VLDL: 13.8 mg/dL (ref 0.0–40.0)

## 2015-10-03 LAB — PSA: PSA: 0.6 ng/mL (ref 0.10–4.00)

## 2015-10-03 LAB — BASIC METABOLIC PANEL
BUN: 16 mg/dL (ref 6–23)
CO2: 28 mEq/L (ref 19–32)
Calcium: 9.4 mg/dL (ref 8.4–10.5)
Chloride: 104 mEq/L (ref 96–112)
Creatinine, Ser: 1.13 mg/dL (ref 0.40–1.50)
GFR: 72.21 mL/min (ref >60.00)
Glucose, Bld: 116 mg/dL — ABNORMAL HIGH (ref 70–99)
POTASSIUM: 4.2 meq/L (ref 3.5–5.1)
Sodium: 139 mEq/L (ref 135–145)

## 2015-10-03 LAB — HEPATIC FUNCTION PANEL
ALBUMIN: 4.3 g/dL (ref 3.5–5.2)
ALT: 19 U/L (ref 0–53)
AST: 16 U/L (ref 0–37)
Alkaline Phosphatase: 56 U/L (ref 39–117)
BILIRUBIN TOTAL: 0.6 mg/dL (ref 0.2–1.2)
Bilirubin, Direct: 0.1 mg/dL (ref 0.0–0.3)
TOTAL PROTEIN: 6.1 g/dL (ref 6.0–8.3)

## 2015-10-03 LAB — TSH: TSH: 2.91 u[IU]/mL (ref 0.35–4.50)

## 2015-10-04 ENCOUNTER — Other Ambulatory Visit (INDEPENDENT_AMBULATORY_CARE_PROVIDER_SITE_OTHER): Payer: Commercial Managed Care - HMO

## 2015-10-04 DIAGNOSIS — R739 Hyperglycemia, unspecified: Secondary | ICD-10-CM | POA: Diagnosis not present

## 2015-10-04 LAB — HEMOGLOBIN A1C: Hgb A1c MFr Bld: 5.7 % (ref 4.6–6.5)

## 2015-10-10 ENCOUNTER — Encounter: Payer: Self-pay | Admitting: Family Medicine

## 2015-10-10 ENCOUNTER — Ambulatory Visit (INDEPENDENT_AMBULATORY_CARE_PROVIDER_SITE_OTHER): Payer: Commercial Managed Care - HMO | Admitting: Family Medicine

## 2015-10-10 VITALS — BP 120/80 | Temp 97.5°F | Ht 74.5 in | Wt 240.0 lb

## 2015-10-10 DIAGNOSIS — Z0000 Encounter for general adult medical examination without abnormal findings: Secondary | ICD-10-CM

## 2015-10-10 DIAGNOSIS — Z23 Encounter for immunization: Secondary | ICD-10-CM | POA: Diagnosis not present

## 2015-10-10 DIAGNOSIS — F4322 Adjustment disorder with anxiety: Secondary | ICD-10-CM | POA: Diagnosis not present

## 2015-10-10 DIAGNOSIS — E785 Hyperlipidemia, unspecified: Secondary | ICD-10-CM

## 2015-10-10 MED ORDER — SIMVASTATIN 40 MG PO TABS: 40.0000 mg | ORAL_TABLET | Freq: Every day | ORAL | Status: DC

## 2015-10-10 MED ORDER — CITALOPRAM HYDROBROMIDE 20 MG PO TABS
20.0000 mg | ORAL_TABLET | Freq: Every day | ORAL | Status: DC
Start: 2015-10-10 — End: 2016-11-06

## 2015-10-10 NOTE — Progress Notes (Signed)
Pre visit review using our clinic review tool, if applicable. No additional management support is needed unless otherwise documented below in the visit note. 

## 2015-10-10 NOTE — Progress Notes (Signed)
Subjective:    Patient ID: Jesse [name], male    DOB: 12/24/1960, 54 y.o.   MRN: [id number]  HPI Clair Gulling is a 54 year old married male nonsmoker who comes in today for general physical examination because of a history of hyperlipidemia  He takes Zocor 20 mg daily along with an aspirin tablet. LDL is 135 HDL is 48 total cholesterol 196  He does not get routine eye care......Marland Kitchen recommended yearly eye evaluation by ophthalmology, he does get regular dental care. Colonoscopy 2011 was normal  He was given a flu shot and a tetanus booster today  He also takes Celexa 20 mg at bedtime for mild depression  He has a skin lesion on his left upper anterior chest wall would like checked. Has a history of skin cancer  Social history he's married he lives here in [place] he actually went to [employer] and played soccer. Went to [employer] in graduate school at [employer]. 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   HENT: Negative.   Eyes: Negative.   Respiratory: Negative.   Cardiovascular: Negative.   Gastrointestinal: Negative.   Endocrine: Negative.   Genitourinary: Negative.   Musculoskeletal: Negative.   Skin: Negative.   Allergic/Immunologic: Negative.   Neurological: Negative.   Hematological: Negative.   Psychiatric/Behavioral: Negative.        Objective:   Physical Exam  Constitutional: He is oriented to person, place, and time. He appears well-developed and well-nourished.  HENT:  Head: Normocephalic and atraumatic.  Right Ear: External ear normal.  Left Ear: External ear normal.  Nose: Nose normal.  Mouth/Throat: Oropharynx is clear and moist.  Eyes: Conjunctivae and EOM are normal. Pupils are equal, round, and reactive to light.  Neck: Normal range of motion. Neck supple. No JVD present. No tracheal deviation present. No  thyromegaly present.  Cardiovascular: Normal rate, regular rhythm, normal heart sounds and intact distal pulses.  Exam reveals no gallop and no friction rub.   No murmur heard. No carotid or bruits peripheral pulses 2+ and symmetrical  Pulmonary/Chest: Effort normal and breath sounds normal. No stridor. No respiratory distress. He has no wheezes. He has no rales. He exhibits no tenderness.  Abdominal: Soft. Bowel sounds are normal. He exhibits no distension and no mass. There is no tenderness. There is no rebound and no guarding.  Genitourinary: Rectum normal, prostate normal and penis normal. Guaiac negative stool. No penile tenderness.  Musculoskeletal: Normal range of motion. He exhibits no edema or tenderness.  Scar left forearm and deformity of his wrist. He had a compound fracture in high school playing soccer and to be surgically repaired  Lymphadenopathy:    He has no cervical adenopathy.  Neurological: He is alert and oriented to person, place, and time. He has normal reflexes. No cranial nerve deficit. He exhibits normal muscle tone. Coordination normal.  Skin: Skin is warm and dry. No rash noted. No erythema. No pallor.  Total body skin exam shows multiple freckles moles capillary hemangiomas and seborrheic keratosis. The lesion in question on his left anterior upper chest wall below the left clavicle is a seborrheic keratosis. He has scars from previous skin cancers and other lesions that were removed. Skin otherwise appears normal  Psychiatric: He has a normal mood and affect. His behavior is normal. Judgment and thought content normal.  Nursing note and  vitals reviewed.         Assessment & Plan:  Healthy male  History of hyperlipidemia increase Zocor to 40 mg daily  History of mild anxiety......... continue Celexa 20 mg at bedtime.

## 2015-10-10 NOTE — Patient Instructions (Addendum)
Your LDL is slightly elevated therefore recommend you increase the Zocor from 20 mg a day to 40  Continue good health habits and exercise  Follow-up in one year sooner if any problems  [place] in [place]........ are 2 new adult nurse practitioner's........Marland Kitchen  Dr. Rushie Chestnut

## 2016-11-06 ENCOUNTER — Other Ambulatory Visit: Payer: Self-pay | Admitting: Family Medicine

## 2016-12-23 ENCOUNTER — Other Ambulatory Visit: Payer: Self-pay | Admitting: Family Medicine

## 2017-01-01 ENCOUNTER — Encounter: Payer: Self-pay | Admitting: Gastroenterology

## 2017-02-13 DIAGNOSIS — L821 Other seborrheic keratosis: Secondary | ICD-10-CM | POA: Diagnosis not present

## 2017-02-13 DIAGNOSIS — D225 Melanocytic nevi of trunk: Secondary | ICD-10-CM | POA: Diagnosis not present

## 2017-02-13 DIAGNOSIS — D1801 Hemangioma of skin and subcutaneous tissue: Secondary | ICD-10-CM | POA: Diagnosis not present

## 2017-03-16 DIAGNOSIS — C44519 Basal cell carcinoma of skin of other part of trunk: Secondary | ICD-10-CM | POA: Diagnosis not present

## 2017-03-16 DIAGNOSIS — L570 Actinic keratosis: Secondary | ICD-10-CM | POA: Diagnosis not present

## 2017-03-16 DIAGNOSIS — C44619 Basal cell carcinoma of skin of left upper limb, including shoulder: Secondary | ICD-10-CM | POA: Diagnosis not present

## 2017-06-18 ENCOUNTER — Other Ambulatory Visit: Payer: Self-pay | Admitting: Family Medicine

## 2017-07-17 DIAGNOSIS — C44712 Basal cell carcinoma of skin of right lower limb, including hip: Secondary | ICD-10-CM | POA: Diagnosis not present

## 2017-08-04 ENCOUNTER — Ambulatory Visit: Payer: Commercial Managed Care - HMO | Admitting: Family Medicine

## 2017-08-25 DIAGNOSIS — H1789 Other corneal scars and opacities: Secondary | ICD-10-CM | POA: Diagnosis not present

## 2017-09-09 ENCOUNTER — Ambulatory Visit (INDEPENDENT_AMBULATORY_CARE_PROVIDER_SITE_OTHER): Payer: 59 | Admitting: Family Medicine

## 2017-09-09 ENCOUNTER — Encounter: Payer: Self-pay | Admitting: Family Medicine

## 2017-09-09 VITALS — BP 100/80 | HR 82 | Temp 98.6°F | Ht 74.0 in | Wt 244.0 lb

## 2017-09-09 DIAGNOSIS — K604 Rectal fistula: Secondary | ICD-10-CM

## 2017-09-09 DIAGNOSIS — Z0000 Encounter for general adult medical examination without abnormal findings: Secondary | ICD-10-CM | POA: Diagnosis not present

## 2017-09-09 DIAGNOSIS — Z23 Encounter for immunization: Secondary | ICD-10-CM | POA: Diagnosis not present

## 2017-09-09 DIAGNOSIS — R739 Hyperglycemia, unspecified: Secondary | ICD-10-CM | POA: Diagnosis not present

## 2017-09-09 DIAGNOSIS — E785 Hyperlipidemia, unspecified: Secondary | ICD-10-CM | POA: Diagnosis not present

## 2017-09-09 DIAGNOSIS — Z125 Encounter for screening for malignant neoplasm of prostate: Secondary | ICD-10-CM

## 2017-09-09 LAB — LIPID PANEL
CHOL/HDL RATIO: 4
Cholesterol: 187 mg/dL (ref 0–200)
HDL: 52.7 mg/dL (ref >39.00)
LDL Cholesterol: 122 mg/dL — ABNORMAL HIGH (ref 0–99)
NONHDL: 133.83
TRIGLYCERIDES: 59 mg/dL (ref 0.0–149.0)
VLDL: 11.8 mg/dL (ref 0.0–40.0)

## 2017-09-09 LAB — POCT URINALYSIS DIPSTICK
Bilirubin, UA: NEGATIVE
Glucose, UA: NEGATIVE
Ketones, UA: NEGATIVE
Leukocytes, UA: NEGATIVE
NITRITE UA: NEGATIVE
PH UA: 5.5 (ref 5.0–8.0)
Protein, UA: NEGATIVE
RBC UA: NEGATIVE
Spec Grav, UA: >=1.03 — AB (ref 1.010–1.025)
UROBILINOGEN UA: 0.2 U/dL

## 2017-09-09 LAB — CBC WITH DIFFERENTIAL/PLATELET
BASOS PCT: 0.8 % (ref 0.0–3.0)
Basophils Absolute: 0.1 10*3/uL (ref 0.0–0.1)
EOS PCT: 2.9 % (ref 0.0–5.0)
Eosinophils Absolute: 0.2 10*3/uL (ref 0.0–0.7)
HEMATOCRIT: 44 % (ref 39.0–52.0)
Hemoglobin: 14.9 g/dL (ref 13.0–17.0)
LYMPHS PCT: 41.6 % (ref 12.0–46.0)
Lymphs Abs: 3 10*3/uL (ref 0.7–4.0)
MCHC: 33.8 g/dL (ref 30.0–36.0)
MCV: 86.7 fl (ref 78.0–100.0)
Monocytes Absolute: 0.4 10*3/uL (ref 0.1–1.0)
Monocytes Relative: 4.9 % (ref 3.0–12.0)
NEUTROS ABS: 3.6 10*3/uL (ref 1.4–7.7)
Neutrophils Relative %: 49.8 % (ref 43.0–77.0)
PLATELETS: 206 10*3/uL (ref 150.0–400.0)
RBC: 5.08 Mil/uL (ref 4.22–5.81)
RDW: 14.2 % (ref 11.5–15.5)
WBC: 7.2 10*3/uL (ref 4.0–10.5)

## 2017-09-09 LAB — HEPATIC FUNCTION PANEL
ALK PHOS: 45 U/L (ref 39–117)
ALT: 19 U/L (ref 0–53)
AST: 17 U/L (ref 0–37)
Albumin: 4.4 g/dL (ref 3.5–5.2)
BILIRUBIN DIRECT: 0.1 mg/dL (ref 0.0–0.3)
BILIRUBIN TOTAL: 0.7 mg/dL (ref 0.2–1.2)
Total Protein: 6.3 g/dL (ref 6.0–8.3)

## 2017-09-09 LAB — BASIC METABOLIC PANEL
BUN: 17 mg/dL (ref 6–23)
CALCIUM: 9.8 mg/dL (ref 8.4–10.5)
CO2: 27 mEq/L (ref 19–32)
Chloride: 104 mEq/L (ref 96–112)
Creatinine, Ser: 1.24 mg/dL (ref 0.40–1.50)
GFR: 64.4 mL/min (ref >60.00)
Glucose, Bld: 107 mg/dL — ABNORMAL HIGH (ref 70–99)
Potassium: 4.4 mEq/L (ref 3.5–5.1)
SODIUM: 139 meq/L (ref 135–145)

## 2017-09-09 LAB — TSH: TSH: 2.56 u[IU]/mL (ref 0.35–4.50)

## 2017-09-09 LAB — HEMOGLOBIN A1C: Hgb A1c MFr Bld: 5.9 % (ref 4.6–6.5)

## 2017-09-09 LAB — PSA: PSA: 0.8 ng/mL (ref 0.10–4.00)

## 2017-09-09 MED ORDER — SIMVASTATIN 40 MG PO TABS: 40.0000 mg | ORAL_TABLET | Freq: Every day | ORAL | 4 refills | Status: DC

## 2017-09-09 NOTE — Patient Instructions (Signed)
Continue good diet exercise and health habits  Call Dr. Fanny Skates your general surgeon for evaluation........ 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

## 2017-09-09 NOTE — Progress Notes (Signed)
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 Labs will be done today  He gets routine eye care, dental care, colonoscopy 2011 was normal.  Vaccinations tetanus booster 2016 seasonal flu shot today.  14 point review of systems reviewed and otherwise negative except he's had a painful rectum for one year. Does not recall any history of trauma. He's noticed is been draining some discolored fluid.  Social history.......Marland Kitchen married lives here in [place] one child left at home will be going to [place] next year. Wife works in the [place],.  Exercise...Marland KitchenMarland KitchenMarland Kitchen spending 3 days weekly but hasn't been able to do much recently because of the pain in his rectum.  EKG was done because a history of hyperlipidemia. EKG was normal and unchanged BP 100/80 (BP Location: Right Arm, Patient Position: Sitting, Cuff Size: Normal)   Pulse 82   Temp 98.6 F (37 C) (Oral)   Ht 6\' 2"  (1.88 m)   Wt 244 lb (110.7 kg)   BMI 31.33 kg/m  Is a well-developed well-nourished male no acute distress vital signs stable is afebrile examination HEENT were negative neck was supple thyroid is not enlarged no carotid bruits cardiopulmonary exam normal abdominal exam normal genitalia normal circumcised male rectum appears normal except at the 6:00 position there is a fistula.  Skin exam done because of a history of basal cell carcinoma removal. 200s left shoulder one on his both left breast by his dermatologist 2 months ago. To her healing well the one on his left shoulder has some erythema at the margin. Advised him to go back and see his dermatologist for follow-up.  #1 healthy male  #2 hyperlipidemia......... continue Zocor check labs  #3 rectal fistula............ consult with general surgeon Dr. Dalbert Batman  #4 history of basal cell carcinomas 3...........  follow-up with dermatology.Marland Kitchen

## 2017-09-17 ENCOUNTER — Telehealth: Payer: Self-pay | Admitting: Family Medicine

## 2017-09-17 NOTE — Telephone Encounter (Signed)
° ° ° °  Pt call to say he need a referral to see Dr Fanny Skates at [facility].

## 2017-09-18 ENCOUNTER — Other Ambulatory Visit: Payer: Self-pay | Admitting: Family Medicine

## 2017-09-18 DIAGNOSIS — K604 Rectal fistula: Secondary | ICD-10-CM

## 2017-09-18 NOTE — Telephone Encounter (Signed)
Called pt left a detailed message in regards to his request for a referrall to Dr Renelda Loma, Advised pt to call the office if  more information is needed.

## 2017-10-19 DIAGNOSIS — K602 Anal fissure, unspecified: Secondary | ICD-10-CM | POA: Diagnosis not present

## 2017-10-19 DIAGNOSIS — K641 Second degree hemorrhoids: Secondary | ICD-10-CM | POA: Diagnosis not present

## 2017-10-21 DIAGNOSIS — M238X2 Other internal derangements of left knee: Secondary | ICD-10-CM | POA: Diagnosis not present

## 2018-01-18 DIAGNOSIS — D2262 Melanocytic nevi of left upper limb, including shoulder: Secondary | ICD-10-CM | POA: Diagnosis not present

## 2018-01-18 DIAGNOSIS — L570 Actinic keratosis: Secondary | ICD-10-CM | POA: Diagnosis not present

## 2018-01-18 DIAGNOSIS — D225 Melanocytic nevi of trunk: Secondary | ICD-10-CM | POA: Diagnosis not present

## 2018-01-18 DIAGNOSIS — D2261 Melanocytic nevi of right upper limb, including shoulder: Secondary | ICD-10-CM | POA: Diagnosis not present

## 2018-01-22 DIAGNOSIS — M25562 Pain in left knee: Secondary | ICD-10-CM | POA: Diagnosis not present

## 2018-01-27 ENCOUNTER — Telehealth: Payer: Self-pay | Admitting: Family Medicine

## 2018-01-27 MED ORDER — SIMVASTATIN 40 MG PO TABS: 40.0000 mg | ORAL_TABLET | Freq: Every day | ORAL | 2 refills | Status: DC

## 2018-01-27 NOTE — Telephone Encounter (Signed)
Simvastatin sent to [facility] as requested

## 2018-01-27 NOTE — Telephone Encounter (Signed)
Copied from [place] [id number]. Topic: Quick Communication - See Telephone Encounter >> Jan 27, 2018 10:04 AM Burnis Medin, NT wrote: CRM for notification. See Telephone encounter for: Johnsie Cancel is calling to get approval for simvastatin (ZOCOR) 40 MG tablet. She asked if this prescription be sent electronically to [phone]  01/27/18.

## 2018-02-08 DIAGNOSIS — S83281A Other tear of lateral meniscus, current injury, right knee, initial encounter: Secondary | ICD-10-CM | POA: Diagnosis not present

## 2018-02-08 DIAGNOSIS — M170 Bilateral primary osteoarthritis of knee: Secondary | ICD-10-CM | POA: Diagnosis not present

## 2018-02-08 DIAGNOSIS — S83241A Other tear of medial meniscus, current injury, right knee, initial encounter: Secondary | ICD-10-CM | POA: Diagnosis not present

## 2018-02-23 DIAGNOSIS — G8918 Other acute postprocedural pain: Secondary | ICD-10-CM | POA: Diagnosis not present

## 2018-02-23 DIAGNOSIS — M1712 Unilateral primary osteoarthritis, left knee: Secondary | ICD-10-CM | POA: Diagnosis not present

## 2018-02-23 DIAGNOSIS — S83272A Complex tear of lateral meniscus, current injury, left knee, initial encounter: Secondary | ICD-10-CM | POA: Diagnosis not present

## 2018-02-23 DIAGNOSIS — S83282A Other tear of lateral meniscus, current injury, left knee, initial encounter: Secondary | ICD-10-CM | POA: Diagnosis not present

## 2018-02-23 DIAGNOSIS — S83242A Other tear of medial meniscus, current injury, left knee, initial encounter: Secondary | ICD-10-CM | POA: Diagnosis not present

## 2018-02-23 DIAGNOSIS — S83232A Complex tear of medial meniscus, current injury, left knee, initial encounter: Secondary | ICD-10-CM | POA: Diagnosis not present

## 2018-03-02 DIAGNOSIS — M25662 Stiffness of left knee, not elsewhere classified: Secondary | ICD-10-CM | POA: Diagnosis not present

## 2018-04-13 ENCOUNTER — Ambulatory Visit (INDEPENDENT_AMBULATORY_CARE_PROVIDER_SITE_OTHER)
Admission: RE | Admit: 2018-04-13 | Discharge: 2018-04-13 | Disposition: A | Discharge status: Home / Self Care | Payer: 59 | Source: Ambulatory Visit | Attending: Family Medicine | Admitting: Family Medicine

## 2018-04-13 ENCOUNTER — Ambulatory Visit: Payer: 59 | Admitting: Family Medicine

## 2018-04-13 ENCOUNTER — Encounter: Payer: Self-pay | Admitting: Family Medicine

## 2018-04-13 VITALS — BP 112/80 | HR 76 | Temp 98.2°F | Ht 74.0 in | Wt 235.8 lb

## 2018-04-13 DIAGNOSIS — R0781 Pleurodynia: Secondary | ICD-10-CM

## 2018-04-13 DIAGNOSIS — S2241XA Multiple fractures of ribs, right side, initial encounter for closed fracture: Secondary | ICD-10-CM | POA: Diagnosis not present

## 2018-04-13 NOTE — Progress Notes (Signed)
   Subjective:    Patient ID: Jesse [name], male    DOB: 01-06-1961, 57 y.o.   MRN: [id number]  HPI Here for 2 weeks of sharp pain in the right ribs. He thinks this started after he leaned over a chain link fence to retrieve a soccer ball. No SOB but taking a deep breath hurts.    Review of Systems  Constitutional: Negative.   Respiratory: Negative.   Cardiovascular: Positive for chest pain. Negative for palpitations and leg swelling.       Objective:   Physical Exam  Constitutional: He appears well-developed and well-nourished.  Cardiovascular: Normal rate, regular rhythm, normal heart sounds and intact distal pulses.  Pulmonary/Chest: Effort normal and breath sounds normal. No stridor. No respiratory distress. He has no wheezes. He has no rales.  Tender over the right lateral inferior ribs. No crepitus           Assessment & Plan:  Rib pain, probably either a cracked rib or a rib separation. Get Xrays today. He can take 2 Aleve tablets bid prn pain.  Alysia Penna, MD

## 2018-04-27 ENCOUNTER — Ambulatory Visit: Payer: 59 | Admitting: Family Medicine

## 2018-04-27 ENCOUNTER — Encounter: Payer: Self-pay | Admitting: Family Medicine

## 2018-04-27 VITALS — BP 120/72 | HR 92 | Temp 98.2°F | Ht 74.0 in | Wt 234.2 lb

## 2018-04-27 DIAGNOSIS — T8069XA Other serum reaction due to other serum, initial encounter: Secondary | ICD-10-CM

## 2018-04-27 NOTE — Progress Notes (Signed)
   Subjective:    Patient ID: Jesse [name], male    DOB: 07-Jun-1961, 57 y.o.   MRN: [id number]  HPI Here for advice about immunizations. He and his family will be travelling to [place] next month, and he has been getting a series of vaccines to protect against infectious diseases. He has been getting these at a [facility] clinic in [place]. 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 Within 24 hours he developed stiffness in the arms and legs, along with muscle aches. Then over the next week he developed swollen lymph nodes in the neck. No cough or SOB. No headache or fever or rashes. Over the past week the muscle aches and stiffness have resolved, and the neck nodes are going back down. Normally he would receive a booster shot in the coming week, but he is worried about getting this.    Review of Systems  Constitutional: Negative.   Respiratory: Negative.   Cardiovascular: Negative.   Musculoskeletal: Positive for myalgias.  Neurological: Negative.   Hematological: Positive for adenopathy.       Objective:   Physical Exam  Constitutional: He is oriented to person, place, and time. He appears well-developed and well-nourished.  Neck: Neck supple. No thyromegaly present.  Cardiovascular: Normal rate, regular rhythm, normal heart sounds and intact distal pulses.  Pulmonary/Chest: Effort normal and breath sounds normal. No stridor. No respiratory distress. He has no wheezes. He has no rales.  Musculoskeletal: Normal range of motion. He exhibits no tenderness.  Lymphadenopathy:    He has no cervical adenopathy.  Neurological: He is alert and oriented to person, place, and time.          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 I advised him to never get one of these vaccines again, and he agreed. Recheck prn.  Alysia Penna, MD

## 2018-07-02 DIAGNOSIS — M25561 Pain in right knee: Secondary | ICD-10-CM | POA: Diagnosis not present

## 2018-07-14 DIAGNOSIS — S83241D Other tear of medial meniscus, current injury, right knee, subsequent encounter: Secondary | ICD-10-CM | POA: Diagnosis not present

## 2018-08-02 DIAGNOSIS — D3617 Benign neoplasm of peripheral nerves and autonomic nervous system of trunk, unspecified: Secondary | ICD-10-CM | POA: Diagnosis not present

## 2018-08-02 DIAGNOSIS — Z85828 Personal history of other malignant neoplasm of skin: Secondary | ICD-10-CM | POA: Diagnosis not present

## 2018-08-02 DIAGNOSIS — C44519 Basal cell carcinoma of skin of other part of trunk: Secondary | ICD-10-CM | POA: Diagnosis not present

## 2018-08-02 DIAGNOSIS — D2271 Melanocytic nevi of right lower limb, including hip: Secondary | ICD-10-CM | POA: Diagnosis not present

## 2018-08-02 DIAGNOSIS — L821 Other seborrheic keratosis: Secondary | ICD-10-CM | POA: Diagnosis not present

## 2018-09-02 ENCOUNTER — Other Ambulatory Visit: Payer: Self-pay | Admitting: Family Medicine

## 2018-09-21 DIAGNOSIS — S83231A Complex tear of medial meniscus, current injury, right knee, initial encounter: Secondary | ICD-10-CM | POA: Diagnosis not present

## 2018-09-21 DIAGNOSIS — M94261 Chondromalacia, right knee: Secondary | ICD-10-CM | POA: Diagnosis not present

## 2018-09-21 DIAGNOSIS — G8918 Other acute postprocedural pain: Secondary | ICD-10-CM | POA: Diagnosis not present

## 2018-09-21 DIAGNOSIS — S83271A Complex tear of lateral meniscus, current injury, right knee, initial encounter: Secondary | ICD-10-CM | POA: Diagnosis not present

## 2019-02-07 DIAGNOSIS — D2261 Melanocytic nevi of right upper limb, including shoulder: Secondary | ICD-10-CM | POA: Diagnosis not present

## 2019-02-07 DIAGNOSIS — C4441 Basal cell carcinoma of skin of scalp and neck: Secondary | ICD-10-CM | POA: Diagnosis not present

## 2019-02-07 DIAGNOSIS — D2262 Melanocytic nevi of left upper limb, including shoulder: Secondary | ICD-10-CM | POA: Diagnosis not present

## 2019-02-07 DIAGNOSIS — D225 Melanocytic nevi of trunk: Secondary | ICD-10-CM | POA: Diagnosis not present

## 2019-10-11 ENCOUNTER — Other Ambulatory Visit: Payer: Self-pay

## 2019-10-11 DIAGNOSIS — Z20822 Contact with and (suspected) exposure to covid-19: Secondary | ICD-10-CM

## 2019-10-12 LAB — NOVEL CORONAVIRUS, NAA: SARS-CoV-2, NAA: NOT DETECTED

## 2019-12-09 ENCOUNTER — Other Ambulatory Visit: Payer: Self-pay

## 2019-12-12 ENCOUNTER — Ambulatory Visit (INDEPENDENT_AMBULATORY_CARE_PROVIDER_SITE_OTHER): Payer: 59 | Admitting: Family Medicine

## 2019-12-12 ENCOUNTER — Other Ambulatory Visit: Payer: Self-pay

## 2019-12-12 ENCOUNTER — Encounter: Payer: Self-pay | Admitting: Family Medicine

## 2019-12-12 VITALS — BP 120/62 | HR 79 | Temp 96.6°F | Wt 231.6 lb

## 2019-12-12 DIAGNOSIS — Z0000 Encounter for general adult medical examination without abnormal findings: Secondary | ICD-10-CM

## 2019-12-12 DIAGNOSIS — U071 COVID-19: Secondary | ICD-10-CM | POA: Diagnosis not present

## 2019-12-12 DIAGNOSIS — C44310 Basal cell carcinoma of skin of unspecified parts of face: Secondary | ICD-10-CM | POA: Diagnosis not present

## 2019-12-12 DIAGNOSIS — R590 Localized enlarged lymph nodes: Secondary | ICD-10-CM

## 2019-12-12 DIAGNOSIS — E782 Mixed hyperlipidemia: Secondary | ICD-10-CM

## 2019-12-12 NOTE — Progress Notes (Signed)
   Subjective:    Patient ID: Jesse [name], male    DOB: 01/09/1961, 59 y.o.   MRN: [id number]  HPI Here to establish after transferring from the practice of Dr. Stevie Kern. He normally does well but he did have a Covid-19 infection in July. He had a runny nose and sweats for 24 hours and then it went away. [facility]. Then on 09-25-19 he got a flu shot in the left upper arm, and within several days the lymph nodes on the left side of his neck swelled up. He had no fevers or any other symptoms. These remained swollen for a month or so, and then most of them went away. He then saw Dr. Roseanne Kaufman for shoulder pain and aksed him about the nodes. Dr. Amedeo Plenty was concerned and referred hi to see Dr. Janace Hoard at [facility]NT. He felt these were most likely simple reactive nodes from the flu shot, but to be certain he did a fine needle biopsy recently with results still pending. He had a colonoscopy on 03-13-10 with no polyps being found, but he has not heard from GI about a follow up.    Review of Systems  Constitutional: Negative.   Respiratory: Negative.   Cardiovascular: Negative.   Gastrointestinal: Negative.   Genitourinary: Negative.   Neurological: Negative.   Hematological: Positive for adenopathy.       Objective:   Physical Exam Constitutional:      Appearance: Normal appearance.  HENT:     Mouth/Throat:     Comments: Single 1 cm non-tender node along the left AC chain  Cardiovascular:     Rate and Rhythm: Normal rate and regular rhythm.     Pulses: Normal pulses.     Heart sounds: Normal heart sounds.  Pulmonary:     Effort: Pulmonary effort is normal.     Breath sounds: Normal breath sounds.  Neurological:     General: No focal deficit present.     Mental Status: He is alert and oriented to person, place, and time.           Assessment & Plan:  Intro visit. We await the results of his node biopsy. He is due for another colonoscopy so we will  notify the GI office. He will return soon for a well exam and fasting labs.  Alysia Penna, MD

## 2019-12-23 ENCOUNTER — Ambulatory Visit: Payer: 59 | Attending: Internal Medicine

## 2019-12-23 DIAGNOSIS — Z20822 Contact with and (suspected) exposure to covid-19: Secondary | ICD-10-CM

## 2019-12-24 LAB — NOVEL CORONAVIRUS, NAA: SARS-CoV-2, NAA: NOT DETECTED

## 2020-01-09 ENCOUNTER — Other Ambulatory Visit: Payer: Self-pay

## 2020-01-10 ENCOUNTER — Encounter: Payer: Self-pay | Admitting: Family Medicine

## 2020-01-10 ENCOUNTER — Other Ambulatory Visit (INDEPENDENT_AMBULATORY_CARE_PROVIDER_SITE_OTHER): Payer: 59

## 2020-01-10 ENCOUNTER — Ambulatory Visit: Payer: 59 | Admitting: Family Medicine

## 2020-01-10 VITALS — BP 110/62 | HR 81 | Temp 97.3°F | Ht 74.0 in | Wt 233.2 lb

## 2020-01-10 DIAGNOSIS — Z0000 Encounter for general adult medical examination without abnormal findings: Secondary | ICD-10-CM

## 2020-01-10 LAB — LIPID PANEL
Cholesterol: 231 mg/dL — ABNORMAL HIGH (ref 0–200)
HDL: 56.9 mg/dL (ref >39.00)
LDL Cholesterol: 162 mg/dL — ABNORMAL HIGH (ref 0–99)
NonHDL: 174.48
Total CHOL/HDL Ratio: 4
Triglycerides: 64 mg/dL (ref 0.0–149.0)
VLDL: 12.8 mg/dL (ref 0.0–40.0)

## 2020-01-10 LAB — BASIC METABOLIC PANEL
BUN: 15 mg/dL (ref 6–23)
CO2: 31 mEq/L (ref 19–32)
Calcium: 9.1 mg/dL (ref 8.4–10.5)
Chloride: 103 mEq/L (ref 96–112)
Creatinine, Ser: 1.01 mg/dL (ref 0.40–1.50)
GFR: 76.12 mL/min (ref >60.00)
Glucose, Bld: 100 mg/dL — ABNORMAL HIGH (ref 70–99)
Potassium: 4.2 mEq/L (ref 3.5–5.1)
Sodium: 138 mEq/L (ref 135–145)

## 2020-01-10 LAB — CBC WITH DIFFERENTIAL/PLATELET
Basophils Absolute: 0.1 10*3/uL (ref 0.0–0.1)
Basophils Relative: 0.8 % (ref 0.0–3.0)
Eosinophils Absolute: 0.1 10*3/uL (ref 0.0–0.7)
Eosinophils Relative: 1.7 % (ref 0.0–5.0)
HCT: 44.3 % (ref 39.0–52.0)
Hemoglobin: 14.7 g/dL (ref 13.0–17.0)
Lymphocytes Relative: 44.8 % (ref 12.0–46.0)
Lymphs Abs: 3 10*3/uL (ref 0.7–4.0)
MCHC: 33.1 g/dL (ref 30.0–36.0)
MCV: 88.5 fl (ref 78.0–100.0)
Monocytes Absolute: 0.4 10*3/uL (ref 0.1–1.0)
Monocytes Relative: 5.9 % (ref 3.0–12.0)
Neutro Abs: 3.2 10*3/uL (ref 1.4–7.7)
Neutrophils Relative %: 46.8 % (ref 43.0–77.0)
Platelets: 175 10*3/uL (ref 150.0–400.0)
RBC: 5.01 Mil/uL (ref 4.22–5.81)
RDW: 15.3 % (ref 11.5–15.5)
WBC: 6.8 10*3/uL (ref 4.0–10.5)

## 2020-01-10 LAB — HEPATIC FUNCTION PANEL
ALT: 12 U/L (ref 0–53)
AST: 13 U/L (ref 0–37)
Albumin: 4.2 g/dL (ref 3.5–5.2)
Alkaline Phosphatase: 47 U/L (ref 39–117)
Bilirubin, Direct: 0.1 mg/dL (ref 0.0–0.3)
Total Bilirubin: 0.9 mg/dL (ref 0.2–1.2)
Total Protein: 5.9 g/dL — ABNORMAL LOW (ref 6.0–8.3)

## 2020-01-10 LAB — TSH: TSH: 2.8 u[IU]/mL (ref 0.35–4.50)

## 2020-01-10 LAB — PSA: PSA: 0.47 ng/mL (ref 0.10–4.00)

## 2020-01-10 MED ORDER — SIMVASTATIN 40 MG PO TABS: 40.0000 mg | ORAL_TABLET | Freq: Every day | ORAL | 3 refills | Status: DC

## 2020-01-10 NOTE — Addendum Note (Signed)
Addended by: Alysia Penna A on: 01/10/2020 10:04 AM   Modules accepted: Orders

## 2020-01-10 NOTE — Progress Notes (Signed)
Subjective:    Patient ID: Jesse [name], male    DOB: 1961-01-05, 59 y.o.   MRN: [id number]  HPI Here for a well exam. He feels fine. As we noted in our visit on 12-12-19 he had a cervical lymph node biopsied by Dr. Amedeo Plenty, and the pathology report showed benign inflammatory cells only. In fact since then the node has resolved completely. He will be due for a colonoscopy in April.    Review of Systems  Constitutional: Negative.   HENT: Negative.   Eyes: Negative.   Respiratory: Negative.   Cardiovascular: Negative.   Gastrointestinal: Negative.   Genitourinary: Negative.   Musculoskeletal: Negative.   Skin: Negative.   Neurological: Negative.   Psychiatric/Behavioral: Negative.        Objective:   Physical Exam Constitutional:      General: He is not in acute distress.    Appearance: He is well-developed. He is not diaphoretic.  HENT:     Head: Normocephalic and atraumatic.     Right Ear: External ear normal.     Left Ear: External ear normal.     Nose: Nose normal.     Mouth/Throat:     Pharynx: No oropharyngeal exudate.  Eyes:     General: No scleral icterus.       Right eye: No discharge.        Left eye: No discharge.     Conjunctiva/sclera: Conjunctivae normal.     Pupils: Pupils are equal, round, and reactive to light.  Neck:     Thyroid: No thyromegaly.     Vascular: No JVD.     Trachea: No tracheal deviation.  Cardiovascular:     Rate and Rhythm: Normal rate and regular rhythm.     Heart sounds: Normal heart sounds. No murmur. No friction rub. No gallop.   Pulmonary:     Effort: Pulmonary effort is normal. No respiratory distress.     Breath sounds: Normal breath sounds. No wheezing or rales.  Chest:     Chest wall: No tenderness.  Abdominal:     General: Bowel sounds are normal. There is no distension.     Palpations: Abdomen is soft. There is no mass.     Tenderness: There is no abdominal tenderness. There is no guarding or rebound.   Genitourinary:    Penis: Normal. No tenderness.      Prostate: Normal.     Rectum: Normal. Guaiac result negative.  Musculoskeletal:        General: No tenderness. Normal range of motion.     Cervical back: Neck supple.  Lymphadenopathy:     Cervical: No cervical adenopathy.  Skin:    General: Skin is warm and dry.     Coloration: Skin is not pale.     Findings: No erythema or rash.  Neurological:     Mental Status: He is alert and oriented to person, place, and time.     Cranial Nerves: No cranial nerve deficit.     Motor: No abnormal muscle tone.     Coordination: Coordination normal.     Deep Tendon Reflexes: Reflexes are normal and symmetric. Reflexes normal.  Psychiatric:        Behavior: Behavior normal.        Thought Content: Thought content normal.        Judgment: Judgment normal.           Assessment & Plan:  Well exam. We discussed diet and exercise. Get fasting labs.  Alysia Penna, MD

## 2020-01-18 ENCOUNTER — Encounter: Payer: Self-pay | Admitting: Gastroenterology

## 2020-02-10 ENCOUNTER — Other Ambulatory Visit: Payer: Self-pay | Admitting: Otolaryngology

## 2020-02-10 DIAGNOSIS — R590 Localized enlarged lymph nodes: Secondary | ICD-10-CM

## 2020-02-22 ENCOUNTER — Ambulatory Visit (AMBULATORY_SURGERY_CENTER): Payer: Self-pay | Admitting: *Deleted

## 2020-02-22 ENCOUNTER — Other Ambulatory Visit: Payer: Self-pay

## 2020-02-22 ENCOUNTER — Encounter (HOSPITAL_BASED_OUTPATIENT_CLINIC_OR_DEPARTMENT_OTHER): Payer: Self-pay | Admitting: Otolaryngology

## 2020-02-22 VITALS — Temp 98.0°F | Ht 74.0 in | Wt 228.0 lb

## 2020-02-22 DIAGNOSIS — Z8371 Family history of colonic polyps: Secondary | ICD-10-CM

## 2020-02-22 DIAGNOSIS — Z01818 Encounter for other preprocedural examination: Secondary | ICD-10-CM

## 2020-02-22 DIAGNOSIS — Z1211 Encounter for screening for malignant neoplasm of colon: Secondary | ICD-10-CM

## 2020-02-22 NOTE — Progress Notes (Signed)

## 2020-02-28 ENCOUNTER — Other Ambulatory Visit (HOSPITAL_COMMUNITY): Payer: 59 | Attending: Otolaryngology

## 2020-02-28 ENCOUNTER — Other Ambulatory Visit: Payer: Self-pay

## 2020-02-28 ENCOUNTER — Ambulatory Visit
Admission: RE | Admit: 2020-02-28 | Discharge: 2020-02-28 | Disposition: A | Discharge status: Home / Self Care | Payer: 59 | Source: Ambulatory Visit | Attending: Otolaryngology | Admitting: Otolaryngology

## 2020-02-28 DIAGNOSIS — R590 Localized enlarged lymph nodes: Secondary | ICD-10-CM

## 2020-02-28 MED ORDER — IOPAMIDOL (ISOVUE-300) INJECTION 61%
75.0000 mL | Freq: Once | INTRAVENOUS | Status: AC | PRN
  Administered 2020-02-28: 75 mL via INTRAVENOUS

## 2020-02-29 ENCOUNTER — Ambulatory Visit (INDEPENDENT_AMBULATORY_CARE_PROVIDER_SITE_OTHER): Payer: 59

## 2020-02-29 ENCOUNTER — Other Ambulatory Visit: Payer: Self-pay | Admitting: Gastroenterology

## 2020-02-29 DIAGNOSIS — Z1159 Encounter for screening for other viral diseases: Secondary | ICD-10-CM

## 2020-02-29 NOTE — Progress Notes (Signed)
[facility] and left message for Anderson Malta to get Dr. Constance Holster to put in pre-op orders for surgery on 03/05/20.

## 2020-03-01 ENCOUNTER — Other Ambulatory Visit (HOSPITAL_COMMUNITY)
Admission: RE | Admit: 2020-03-01 | Discharge: 2020-03-01 | Disposition: A | Discharge status: Home / Self Care | Payer: 59 | Source: Ambulatory Visit | Attending: Otolaryngology | Admitting: Otolaryngology

## 2020-03-01 ENCOUNTER — Encounter: Payer: Self-pay | Admitting: Gastroenterology

## 2020-03-01 DIAGNOSIS — Z01812 Encounter for preprocedural laboratory examination: Secondary | ICD-10-CM | POA: Insufficient documentation

## 2020-03-01 DIAGNOSIS — Z20822 Contact with and (suspected) exposure to covid-19: Secondary | ICD-10-CM | POA: Insufficient documentation

## 2020-03-01 LAB — SARS CORONAVIRUS 2 (TAT 6-24 HRS)
SARS Coronavirus 2: NEGATIVE
SARS Coronavirus 2: NEGATIVE

## 2020-03-01 NOTE — H&P (Signed)
HPI:   Jesse [name] is a 59 y.o. male who presents as a new patient for consult by Dr Sarajane Jews with a chief complaint of lymphadenopathy. He has had this since about November. He is absolutely certain that it started right after his flu shot. He is neck within 12 hours after the flu shot swelled up bilaterally. That has now gone down significantly and at least 50% improvement in the swelling/lymphadenopathy. He has not had this previously. He has no dysphagia or odynophagia of the upper airway. He does have food occasionally sticking in the mid to lower esophagus region. This has been going on for years.. No sores in his mouth. No hoarseness.. He has no fever, night sweats, chills, or weight loss.  PMH/Meds/All/SocHx/FamHx/ROS:   History reviewed. No pertinent past medical history.  Past Surgical History:  Procedure Laterality Date  . COLONOSCOPY   No family history of bleeding disorders, wound healing problems or difficulty with anesthesia.   Social History   Socioeconomic History  . Marital status: Unknown  Spouse name: Not on file  . Number of children: Not on file  . Years of education: Not on file  . Highest education level: Not on file  Occupational History  . Not on file  Social Needs  . Financial resource strain: Not on file  . Food insecurity  Worry: Not on file  Inability: Not on file  . Transportation needs  Medical: Not on file  Non-medical: Not on file  Tobacco Use  . Smoking status: Never Smoker  . Smokeless tobacco: Current User  Types: Snuff  Substance and Sexual Activity  . Alcohol use: Yes  . Drug use: Never  . Sexual activity: Yes  Lifestyle  . Physical activity  Days per week: Not on file  Minutes per session: Not on file  . Stress: Not on file  Relationships  . Social Medical illustrator on phone: Not on file  Gets together: Not on file  Attends religious service: Not on file  Active member of club or organization: Not on file  Attends meetings of clubs  or organizations: Not on file  Relationship status: Not on file  Other Topics Concern  . Not on file  Social History Narrative  . Not on file   Current Outpatient Medications:  . simvastatin (ZOCOR) 40 MG tablet, Take 40 mg by mouth nightly., Disp: , Rfl:   A complete ROS was performed with pertinent positives/negatives noted in the HPI. The remainder of the ROS are negative. The following portions of the patient's history were reviewed and updated as appropriate: allergies, current medications, past medical history, past surgical history, past history, past family history, and problem list.    Physical Exam:   BP 136/87 (Site: Left arm, Position: Sitting, BP Cuff Size: Large)  Pulse 75  Temp 97.5 F (36.4 C) (Temporal)  Resp 14  Ht 1.88 m (6\' 2" )  Wt 107 kg (235 lb 12.8 oz)  BMI 30.27 kg/m   General Awake, at baseline alertness during examination.  Eyes No scleral icterus or conjunctival hemorrhage. Globe position appears normal. EOMI.  Right Ear EAC patent, TM intact w/o inflammation. Middle ear well aerated.  Left Ear EAC patent, TM intact w/o inflammation. Middle ear well aerated.  Nose Patent, no polyps or masses seen on anterior rhinoscopy.  Oral cavity No mucosal lesions or tumors seen. Tongue midline.  Oropharynx Symmetric tonsils.  HP/Larynx Mirror- no evidence of lesions  Neck there is multiple nodes in both sides of  his neck. There is jugulodigastric nodes that are about 2 cm in both sides. They are mobile and nontender. The posterior chain has a couple of small nodes the left one is about 2 cm. He had thought he had a supraclavicular node that he had easily palpated but I do not feel that today. No abnormal cervical lymphadenopathy. No thyromegaly. No thyroid masses palpated.  Cardio-vascular No cyanosis.  Pulmonary No audible stridor. Breathing easily with no labor.  Neuro Symmetric facial movement.  Psychiatry Appropriate affect and mood for clinic visit.   Other  Findings     Independent Review of Additional Tests or Records:  None  Procedures:  Fine Needle Aspiration  The procedure was dicussed and the patient wants to proceed. We discussed the risks, benefits, and options. All questions were answered and consent obtained.   The area of the left posterior neck mass was cleaned with alcohol. A 22 gauge needle was passed into the mass with aspiration negative pressure and no blood was encountered, The needle was moved gently several times to acquire a sample into the needle lumen. This was performed again with same gauge needle. Both specimens were prepared on slides and the remaining material washed in cytology solution. The patient tolerated this well. No bleeding or swelling.   Impression & Plans:  Jesse [name] is a 59 y.o. male with lymph nodes that have been present since November but substantially decreased. They seem to be directly correlated and time with the flu shot. He continues with some of the nodes so a fine-needle aspiration was performed. He tolerated that well. We will have the answer to that information in about a week and he will then come in for further discussion.

## 2020-03-02 ENCOUNTER — Other Ambulatory Visit: Payer: Self-pay

## 2020-03-02 ENCOUNTER — Ambulatory Visit (AMBULATORY_SURGERY_CENTER): Payer: 59 | Admitting: Gastroenterology

## 2020-03-02 ENCOUNTER — Encounter: Payer: Self-pay | Admitting: Gastroenterology

## 2020-03-02 VITALS — BP 124/79 | HR 68 | Temp 96.8°F | Resp 18 | Ht 74.0 in | Wt 228.0 lb

## 2020-03-02 DIAGNOSIS — Z1211 Encounter for screening for malignant neoplasm of colon: Secondary | ICD-10-CM

## 2020-03-02 DIAGNOSIS — D123 Benign neoplasm of transverse colon: Secondary | ICD-10-CM

## 2020-03-02 DIAGNOSIS — K635 Polyp of colon: Secondary | ICD-10-CM | POA: Diagnosis not present

## 2020-03-02 HISTORY — PX: COLONOSCOPY: SHX174

## 2020-03-02 MED ORDER — SODIUM CHLORIDE 0.9 % IV SOLN: 500.0000 mL | Freq: Once | INTRAVENOUS | Status: DC

## 2020-03-02 NOTE — Progress Notes (Signed)
Report given to PACU, vss 

## 2020-03-02 NOTE — Progress Notes (Signed)
Called to room to assist during endoscopic procedure.  Patient ID and intended procedure confirmed with present staff. Received instructions for my participation in the procedure from the performing physician.  

## 2020-03-02 NOTE — Progress Notes (Signed)
Temp by JB Vitals by CW  Pt's states no medical or surgical changes since previsit or office visit.  

## 2020-03-02 NOTE — Op Note (Signed)
[facility] Patient Name: Jesse [name] Procedure Date: 03/02/2020 1:25 PM MRN: [id number] Endoscopist: Mallie Mussel L. Loletha Carrow , MD Age: 59 Referring MD:  Date of Birth: 1961-02-01 Gender: Male Account #: [id number] Procedure:                Colonoscopy Indications:              Screening for colorectal malignant neoplasm ( no                            polyps 02/2010) Procedure:                Pre-Anesthesia Assessment:                           - Prior to the procedure, a History and Physical                            was performed, and patient medications and                            allergies were reviewed. The patient's tolerance of                            previous anesthesia was also reviewed. The risks                            and benefits of the procedure and the sedation                            options and risks were discussed with the patient.                            All questions were answered, and informed consent                            was obtained. Prior Anticoagulants: The patient has                            taken no previous anticoagulant or antiplatelet                            agents. ASA Grade Assessment: II - A patient with                            mild systemic disease. After reviewing the risks                            and benefits, the patient was deemed in                            satisfactory condition to undergo the procedure.                           After obtaining informed consent, the colonoscope  was passed under direct vision. Throughout the                            procedure, the patient's blood pressure, pulse, and                            oxygen saturations were monitored continuously. The                            Colonoscope was introduced through the anus and                            advanced to the the terminal ileum, with                            identification of the appendiceal  orifice and IC                            valve. The colonoscopy was performed without                            difficulty. The patient tolerated the procedure                            well. The quality of the bowel preparation was                            good. The terminal ileum, ileocecal valve,                            appendiceal orifice, and rectum were photographed.                            The bowel preparation used was Miralax. Scope In: 1:28:19 PM Scope Out: 1:46:05 PM Scope Withdrawal Time: 0 hours 16 minutes 10 seconds  Total Procedure Duration: 0 hours 17 minutes 46 seconds  Findings:                 The perianal and digital rectal examinations were                            normal.                           The terminal ileum appeared normal.                           A 6 mm polyp was found in the transverse colon. The                            polyp was semi-sessile with a mucus cap. The polyp                            was removed with a cold snare. Resection and  retrieval were complete.                           Diverticula were found in the left colon and right                            colon.                           The exam was otherwise without abnormality on                            direct and retroflexion views. Complications:            No immediate complications. Estimated Blood Loss:     Estimated blood loss was minimal. Impression:               - The examined portion of the ileum was normal.                           - One 6 mm polyp in the transverse colon, removed                            with a cold snare. Resected and retrieved.                           - Diverticulosis in the left colon and in the right                            colon.                           - The examination was otherwise normal on direct                            and retroflexion views. Recommendation:           - Patient has a  contact number available for                            emergencies. The signs and symptoms of potential                            delayed complications were discussed with the                            patient. Return to normal activities tomorrow.                            Written discharge instructions were provided to the                            patient.                           - Resume previous diet.                           -  Continue present medications.                           - Await pathology results.                           - Repeat colonoscopy is recommended for                            surveillance. The colonoscopy date will be                            determined after pathology results from today's                            exam become available for review. Robecca Fulgham L. Loletha Carrow, MD 03/02/2020 1:54:42 PM This report has been signed electronically.

## 2020-03-02 NOTE — Patient Instructions (Signed)
   Handout on polyps & diverticulosis given to you today  Await pathology results   YOU HAD AN ENDOSCOPIC PROCEDURE TODAY AT [facility]:   Refer to the procedure report that was given to you for any specific questions about what was found during the examination.  If the procedure report does not answer your questions, please call your gastroenterologist to clarify.  If you requested that your care partner not be given the details of your procedure findings, then the procedure report has been included in a sealed envelope for you to review at your convenience later.  YOU SHOULD EXPECT: Some feelings of bloating in the abdomen. Passage of more gas than usual.  Walking can help get rid of the air that was put into your GI tract during the procedure and reduce the bloating. If you had a lower endoscopy (such as a colonoscopy or flexible sigmoidoscopy) you may notice spotting of blood in your stool or on the toilet paper. If you underwent a bowel prep for your procedure, you may not have a normal bowel movement for a few days.  Please Note:  You might notice some irritation and congestion in your nose or some drainage.  This is from the oxygen used during your procedure.  There is no need for concern and it should clear up in a day or so.  SYMPTOMS TO REPORT IMMEDIATELY:   Following lower endoscopy (colonoscopy or flexible sigmoidoscopy):  Excessive amounts of blood in the stool  Significant tenderness or worsening of abdominal pains  Swelling of the abdomen that is new, acute  Fever of 100F or higher    For urgent or emergent issues, a gastroenterologist can be reached at any hour by calling [phone]. Do not use MyChart messaging for urgent concerns.    DIET:  We do recommend a small meal at first, but then you may proceed to your regular diet.  Drink plenty of fluids but you should avoid alcoholic beverages for 24 hours.  ACTIVITY:  You should plan to take it easy  for the rest of today and you should NOT DRIVE or use heavy machinery until tomorrow (because of the sedation medicines used during the test).    FOLLOW UP: Our staff will call the number listed on your records 48-72 hours following your procedure to check on you and address any questions or concerns that you may have regarding the information given to you following your procedure. If we do not reach you, we will leave a message.  We will attempt to reach you two times.  During this call, we will ask if you have developed any symptoms of COVID 19. If you develop any symptoms (ie: fever, flu-like symptoms, shortness of breath, cough etc.) before then, please call [phone].  If you test positive for Covid 19 in the 2 weeks post procedure, please call and report this information to [place].    If any biopsies were taken you will be contacted by phone or by letter within the next 1-3 weeks.  Please call us at [phone] if you have not heard about the biopsies in 3 weeks.    SIGNATURES/CONFIDENTIALITY: You and/or your care partner have signed paperwork which will be entered into your electronic medical record.  These signatures attest to the fact that that the information above on your After Visit Summary has been reviewed and is understood.  Full responsibility of the confidentiality of this discharge information lies with you and/or your care-partner.

## 2020-03-05 ENCOUNTER — Other Ambulatory Visit: Payer: Self-pay

## 2020-03-05 ENCOUNTER — Encounter (HOSPITAL_BASED_OUTPATIENT_CLINIC_OR_DEPARTMENT_OTHER): Payer: Self-pay | Admitting: Otolaryngology

## 2020-03-05 ENCOUNTER — Ambulatory Visit (HOSPITAL_BASED_OUTPATIENT_CLINIC_OR_DEPARTMENT_OTHER)
Admission: RE | Admit: 2020-03-05 | Discharge: 2020-03-05 | Disposition: A | Discharge status: Home / Self Care | Payer: 59 | Attending: Otolaryngology | Admitting: Otolaryngology

## 2020-03-05 ENCOUNTER — Ambulatory Visit (HOSPITAL_BASED_OUTPATIENT_CLINIC_OR_DEPARTMENT_OTHER): Payer: 59 | Admitting: Anesthesiology

## 2020-03-05 ENCOUNTER — Encounter (HOSPITAL_BASED_OUTPATIENT_CLINIC_OR_DEPARTMENT_OTHER)
Admission: RE | Disposition: A | Discharge status: Home / Self Care | Payer: Self-pay | Source: Home / Self Care | Attending: Otolaryngology

## 2020-03-05 DIAGNOSIS — Z79899 Other long term (current) drug therapy: Secondary | ICD-10-CM | POA: Insufficient documentation

## 2020-03-05 DIAGNOSIS — C9110 Chronic lymphocytic leukemia of B-cell type not having achieved remission: Secondary | ICD-10-CM | POA: Insufficient documentation

## 2020-03-05 DIAGNOSIS — C8581 Other specified types of non-Hodgkin lymphoma, lymph nodes of head, face, and neck: Secondary | ICD-10-CM | POA: Diagnosis not present

## 2020-03-05 DIAGNOSIS — R590 Localized enlarged lymph nodes: Secondary | ICD-10-CM | POA: Diagnosis present

## 2020-03-05 HISTORY — PX: EXCISION MASS NECK: SHX6703

## 2020-03-05 SURGERY — EXCISION, MASS, NECK
Anesthesia: General | Site: Neck

## 2020-03-05 MED ORDER — PROMETHAZINE HCL 25 MG/ML IJ SOLN: 6.2500 mg | INTRAMUSCULAR | Status: DC | PRN

## 2020-03-05 MED ORDER — KETOROLAC TROMETHAMINE 30 MG/ML IJ SOLN
INTRAMUSCULAR | Status: DC | PRN
  Administered 2020-03-05: 30 mg via INTRAVENOUS

## 2020-03-05 MED ORDER — DEXAMETHASONE SODIUM PHOSPHATE 10 MG/ML IJ SOLN
INTRAMUSCULAR | Status: AC
  Filled 2020-03-05: qty 1

## 2020-03-05 MED ORDER — HYDROMORPHONE HCL 1 MG/ML IJ SOLN: 0.2500 mg | INTRAMUSCULAR | Status: DC | PRN

## 2020-03-05 MED ORDER — OXYCODONE HCL 5 MG/5ML PO SOLN: 5.0000 mg | Freq: Once | ORAL | Status: DC | PRN

## 2020-03-05 MED ORDER — LIDOCAINE-EPINEPHRINE 1 %-1:100000 IJ SOLN: INTRAMUSCULAR | Status: DC | PRN

## 2020-03-05 MED ORDER — OXYCODONE HCL 5 MG PO TABS: 5.0000 mg | ORAL_TABLET | Freq: Once | ORAL | Status: DC | PRN

## 2020-03-05 MED ORDER — MEPERIDINE HCL 25 MG/ML IJ SOLN: 6.2500 mg | INTRAMUSCULAR | Status: DC | PRN

## 2020-03-05 MED ORDER — MIDAZOLAM HCL 2 MG/2ML IJ SOLN: 1.0000 mg | INTRAMUSCULAR | Status: DC | PRN

## 2020-03-05 MED ORDER — FENTANYL CITRATE (PF) 100 MCG/2ML IJ SOLN
INTRAMUSCULAR | Status: DC | PRN
  Administered 2020-03-05 (×5): 25 ug via INTRAVENOUS

## 2020-03-05 MED ORDER — KETOROLAC TROMETHAMINE 30 MG/ML IJ SOLN: 30.0000 mg | Freq: Once | INTRAMUSCULAR | Status: DC | PRN

## 2020-03-05 MED ORDER — CEPHALEXIN 500 MG PO CAPS: 500.0000 mg | ORAL_CAPSULE | Freq: Three times a day (TID) | ORAL | 0 refills | Status: DC

## 2020-03-05 MED ORDER — MIDAZOLAM HCL 5 MG/5ML IJ SOLN
INTRAMUSCULAR | Status: DC | PRN
  Administered 2020-03-05: 2 mg via INTRAVENOUS

## 2020-03-05 MED ORDER — ONDANSETRON HCL 4 MG/2ML IJ SOLN
INTRAMUSCULAR | Status: AC
  Filled 2020-03-05: qty 2

## 2020-03-05 MED ORDER — LIDOCAINE 2% (20 MG/ML) 5 ML SYRINGE
INTRAMUSCULAR | Status: DC | PRN
  Administered 2020-03-05: 80 mg via INTRAVENOUS

## 2020-03-05 MED ORDER — FENTANYL CITRATE (PF) 100 MCG/2ML IJ SOLN
INTRAMUSCULAR | Status: AC
  Filled 2020-03-05: qty 2

## 2020-03-05 MED ORDER — LIDOCAINE 2% (20 MG/ML) 5 ML SYRINGE
INTRAMUSCULAR | Status: AC
  Filled 2020-03-05: qty 5

## 2020-03-05 MED ORDER — ONDANSETRON HCL 4 MG/2ML IJ SOLN
INTRAMUSCULAR | Status: DC | PRN
  Administered 2020-03-05: 4 mg via INTRAVENOUS

## 2020-03-05 MED ORDER — PROMETHAZINE HCL 25 MG RE SUPP: 25.0000 mg | Freq: Four times a day (QID) | RECTAL | 1 refills | Status: DC | PRN

## 2020-03-05 MED ORDER — LACTATED RINGERS IV SOLN: INTRAVENOUS | Status: DC

## 2020-03-05 MED ORDER — DEXAMETHASONE SODIUM PHOSPHATE 10 MG/ML IJ SOLN
INTRAMUSCULAR | Status: DC | PRN
  Administered 2020-03-05: 5 mg via INTRAVENOUS

## 2020-03-05 MED ORDER — FENTANYL CITRATE (PF) 100 MCG/2ML IJ SOLN: 50.0000 ug | INTRAMUSCULAR | Status: DC | PRN

## 2020-03-05 MED ORDER — MIDAZOLAM HCL 2 MG/2ML IJ SOLN
INTRAMUSCULAR | Status: AC
  Filled 2020-03-05: qty 2

## 2020-03-05 MED ORDER — KETOROLAC TROMETHAMINE 30 MG/ML IJ SOLN
INTRAMUSCULAR | Status: AC
  Filled 2020-03-05: qty 1

## 2020-03-05 MED ORDER — HYDROCODONE-ACETAMINOPHEN 7.5-325 MG PO TABS: 1.0000 | ORAL_TABLET | Freq: Four times a day (QID) | ORAL | 0 refills | Status: DC | PRN

## 2020-03-05 MED ORDER — PROPOFOL 10 MG/ML IV BOLUS
INTRAVENOUS | Status: DC | PRN
  Administered 2020-03-05: 200 mg via INTRAVENOUS

## 2020-03-05 SURGICAL SUPPLY — 67 items
ADH SKN CLS APL DERMABOND .7 (GAUZE/BANDAGES/DRESSINGS) ×1
APL SKNCLS STERI-STRIP NONHPOA (GAUZE/BANDAGES/DRESSINGS)
ATTRACTOMAT 16X20 MAGNETIC DRP (DRAPES) IMPLANT
BAND INSRT 18 STRL LF DISP RB (MISCELLANEOUS)
BAND RUBBER #18 3X1/16 STRL (MISCELLANEOUS) IMPLANT
BENZOIN TINCTURE PRP APPL 2/3 (GAUZE/BANDAGES/DRESSINGS) IMPLANT
BLADE SURG 15 STRL LF DISP TIS (BLADE) ×1 IMPLANT
BLADE SURG 15 STRL SS (BLADE) ×3
CANISTER SUCT 1200ML W/VALVE (MISCELLANEOUS) ×3 IMPLANT
CLEANER CAUTERY TIP 5X5 PAD (MISCELLANEOUS) ×1 IMPLANT
CLIP VESOCCLUDE MED 6/CT (CLIP) IMPLANT
CLIP VESOCCLUDE SM WIDE 6/CT (CLIP) IMPLANT
CLOSURE WOUND 1/2 X4 (GAUZE/BANDAGES/DRESSINGS)
CORD BIPOLAR FORCEPS 12FT (ELECTRODE) IMPLANT
COVER BACK TABLE 60X90IN (DRAPES) ×3 IMPLANT
COVER MAYO STAND STRL (DRAPES) ×3 IMPLANT
COVER WAND RF STERILE (DRAPES) IMPLANT
DERMABOND ADVANCED (GAUZE/BANDAGES/DRESSINGS) ×2
DERMABOND ADVANCED .7 DNX12 (GAUZE/BANDAGES/DRESSINGS) IMPLANT
DRAIN JACKSON RD 7FR 3/32 (WOUND CARE) IMPLANT
DRAIN PENROSE 1/4X12 LTX STRL (WOUND CARE) IMPLANT
DRAIN SNY 10 ROU (WOUND CARE) IMPLANT
DRAPE U-SHAPE 76X120 STRL (DRAPES) ×3 IMPLANT
ELECT COATED BLADE 2.86 ST (ELECTRODE) ×3 IMPLANT
ELECT REM PT RETURN 9FT ADLT (ELECTROSURGICAL) ×3
ELECTRODE REM PT RTRN 9FT ADLT (ELECTROSURGICAL) ×1 IMPLANT
EVACUATOR SILICONE 100CC (DRAIN) IMPLANT
FORCEPS BIPOLAR SPETZLER 8 1.0 (NEUROSURGERY SUPPLIES) IMPLANT
GAUZE 4X4 16PLY RFD (DISPOSABLE) IMPLANT
GAUZE SPONGE 4X4 12PLY STRL LF (GAUZE/BANDAGES/DRESSINGS) IMPLANT
GLOVE BIO SURGEON STRL SZ 6.5 (GLOVE) ×1 IMPLANT
GLOVE BIO SURGEONS STRL SZ 6.5 (GLOVE) ×1
GLOVE BIOGEL PI IND STRL 7.0 (GLOVE) IMPLANT
GLOVE BIOGEL PI INDICATOR 7.0 (GLOVE) ×4
GLOVE ECLIPSE 6.5 STRL STRAW (GLOVE) ×2 IMPLANT
GLOVE ECLIPSE 7.5 STRL STRAW (GLOVE) ×3 IMPLANT
GOWN STRL REUS W/ TWL LRG LVL3 (GOWN DISPOSABLE) ×1 IMPLANT
GOWN STRL REUS W/ TWL XL LVL3 (GOWN DISPOSABLE) ×1 IMPLANT
GOWN STRL REUS W/TWL LRG LVL3 (GOWN DISPOSABLE) ×3
GOWN STRL REUS W/TWL XL LVL3 (GOWN DISPOSABLE) ×3
NDL PRECISIONGLIDE 27X1.5 (NEEDLE) IMPLANT
NEEDLE PRECISIONGLIDE 27X1.5 (NEEDLE) ×3 IMPLANT
NS IRRIG 1000ML POUR BTL (IV SOLUTION) ×2 IMPLANT
PACK BASIN DAY SURGERY FS (CUSTOM PROCEDURE TRAY) ×3 IMPLANT
PAD CLEANER CAUTERY TIP 5X5 (MISCELLANEOUS) ×2
PENCIL FOOT CONTROL (ELECTRODE) ×3 IMPLANT
SLEEVE SCD COMPRESS KNEE MED (MISCELLANEOUS) ×2 IMPLANT
SPONGE GAUZE 2X2 8PLY STER LF (GAUZE/BANDAGES/DRESSINGS)
SPONGE GAUZE 2X2 8PLY STRL LF (GAUZE/BANDAGES/DRESSINGS) IMPLANT
STAPLER VISISTAT 35W (STAPLE) IMPLANT
STRIP CLOSURE SKIN 1/2X4 (GAUZE/BANDAGES/DRESSINGS) IMPLANT
SUCTION FRAZIER HANDLE 10FR (MISCELLANEOUS)
SUCTION TUBE FRAZIER 10FR DISP (MISCELLANEOUS) IMPLANT
SUT CHROMIC 3 0 PS 2 (SUTURE) IMPLANT
SUT CHROMIC 4 0 P 3 18 (SUTURE) IMPLANT
SUT ETHILON 4 0 PS 2 18 (SUTURE) IMPLANT
SUT ETHILON 5 0 P 3 18 (SUTURE)
SUT NYLON ETHILON 5-0 P-3 1X18 (SUTURE) IMPLANT
SUT PLAIN 5 0 P 3 18 (SUTURE) IMPLANT
SUT SILK 4 0 TIES 17X18 (SUTURE) IMPLANT
SUT VICRYL 4-0 PS2 18IN ABS (SUTURE) IMPLANT
SYR BULB 3OZ (MISCELLANEOUS) IMPLANT
SYR CONTROL 10ML LL (SYRINGE) ×3 IMPLANT
TOWEL GREEN STERILE FF (TOWEL DISPOSABLE) ×3 IMPLANT
TRAY DSU PREP LF (CUSTOM PROCEDURE TRAY) ×3 IMPLANT
TUBE CONNECTING 20'X1/4 (TUBING) ×1
TUBE CONNECTING 20X1/4 (TUBING) ×2 IMPLANT

## 2020-03-05 NOTE — Discharge Instructions (Signed)
° °  Call your surgeon if you experience:   1.  Fever over 101.0. 2.  Inability to urinate. 3.  Nausea and/or vomiting. 4.  Extreme swelling or bruising at the surgical site. 5.  Continued bleeding from the incision. 6.  Increased pain, redness or drainage from the incision. 7.  Problems related to your pain medication. 8.  Any problems and/or concerns Post Anesthesia Home Care Instructions  Activity: Get plenty of rest for the remainder of the day. A responsible individual must stay with you for 24 hours following the procedure.  For the next 24 hours, DO NOT: -Drive a car -Paediatric nurse -Drink alcoholic beverages -Take any medication unless instructed by your physician -Make any legal decisions or sign important papers.  Meals: Start with liquid foods such as gelatin or soup. Progress to regular foods as tolerated. Avoid greasy, spicy, heavy foods. If nausea and/or vomiting occur, drink only clear liquids until the nausea and/or vomiting subsides. Call your physician if vomiting continues.  Special Instructions/Symptoms: Your throat may feel dry or sore from the anesthesia or the breathing tube placed in your throat during surgery. If this causes discomfort, gargle with warm salt water. The discomfort should disappear within 24 hours.  If you had a scopolamine patch placed behind your ear for the management of post- operative nausea and/or vomiting:  1. The medication in the patch is effective for 72 hours, after which it should be removed.  Wrap patch in a tissue and discard in the trash. Wash hands thoroughly with soap and water. 2. You may remove the patch earlier than 72 hours if you experience unpleasant side effects which may include dry mouth, dizziness or visual disturbances. 3. Avoid touching the patch. Wash your hands with soap and water after contact with the patch.    You may shower and use soap and water. Do not use any creams, oils or ointment.

## 2020-03-05 NOTE — Anesthesia Postprocedure Evaluation (Signed)
Anesthesia Post Note  Patient: Jesse [name]  Procedure(s) Performed: EXCISION MASS NECK (N/A Neck)     Patient location during evaluation: PACU Anesthesia Type: General Level of consciousness: awake and alert, oriented and patient cooperative Pain management: pain level controlled Vital Signs Assessment: post-procedure vital signs reviewed and stable Respiratory status: spontaneous breathing, nonlabored ventilation and respiratory function stable Cardiovascular status: blood pressure returned to baseline and stable Postop Assessment: no apparent nausea or vomiting Anesthetic complications: no    Last Vitals:  Vitals:   03/05/20 1011 03/05/20 1015  BP:  123/80  Pulse: 74 76  Resp: 13 12  Temp:    SpO2: 100% 95%    Last Pain:  Vitals:   03/05/20 1015  TempSrc:   PainSc: 0-No pain                 Pervis Hocking

## 2020-03-05 NOTE — Transfer of Care (Signed)
Immediate Anesthesia Transfer of Care Note  Patient: Jesse [name]  Procedure(s) Performed: EXCISION MASS NECK (N/A Neck)  Patient Location: PACU  Anesthesia Type:General  Level of Consciousness: drowsy  Airway & Oxygen Therapy: Patient Spontanous Breathing and Patient connected to face mask oxygen  Post-op Assessment: Report given to RN and Post -op Vital signs reviewed and stable  Post vital signs: Reviewed and stable  Last Vitals:  Vitals Value Taken Time  BP 104/61 03/05/20 0953  Temp    Pulse 71 03/05/20 0957  Resp 12 03/05/20 0957  SpO2 98 % 03/05/20 0957  Vitals shown include unvalidated device data.  Last Pain:  Vitals:   03/05/20 0734  TempSrc: Tympanic  PainSc: 0-No pain         Complications: No apparent anesthesia complications

## 2020-03-05 NOTE — Anesthesia Procedure Notes (Signed)
Procedure Name: LMA Insertion Date/Time: 03/05/2020 9:14 AM Performed by: Gwyndolyn Saxon, CRNA Pre-anesthesia Checklist: Patient identified, Emergency Drugs available, Suction available and Patient being monitored Patient Re-evaluated:Patient Re-evaluated prior to induction Oxygen Delivery Method: Circle system utilized Preoxygenation: Pre-oxygenation with 100% oxygen Induction Type: IV induction Ventilation: Mask ventilation without difficulty LMA: LMA inserted LMA Size: 5.0 Number of attempts: 1 Placement Confirmation: positive ETCO2 and breath sounds checked- equal and bilateral Tube secured with: Tape Dental Injury: Teeth and Oropharynx as per pre-operative assessment

## 2020-03-05 NOTE — Interval H&P Note (Signed)
History and Physical Interval Note:  03/05/2020 8:46 AM  Jesse [name]  has presented today for surgery, with the diagnosis of lymphadenopathy.  The various methods of treatment have been discussed with the patient and family. After consideration of risks, benefits and other options for treatment, the patient has consented to  Procedure(s): EXCISION MASS NECK (N/A) as a surgical intervention.  The patient's history has been reviewed, patient examined, no change in status, stable for surgery.  I have reviewed the patient's chart and labs.  Questions were answered to the patient's satisfaction.     Izora Gala

## 2020-03-05 NOTE — Anesthesia Preprocedure Evaluation (Signed)
Anesthesia Evaluation  Patient identified by MRN, date of birth, ID band Patient awake    Reviewed: Allergy & Precautions, NPO status , Patient's Chart, lab work & pertinent test results  Airway Mallampati: I  TM Distance: >3 FB Neck ROM: Full    Dental no notable dental hx. (+) Teeth Intact, Dental Advisory Given   Pulmonary neg pulmonary ROS,    Pulmonary exam normal breath sounds clear to auscultation       Cardiovascular negative cardio ROS Normal cardiovascular exam Rhythm:Regular Rate:Normal     Neuro/Psych PSYCHIATRIC DISORDERS Anxiety negative neurological ROS     GI/Hepatic negative GI ROS, Neg liver ROS,   Endo/Other  negative endocrine ROS  Renal/GU negative Renal ROS  negative genitourinary   Musculoskeletal negative musculoskeletal ROS (+)   Abdominal Normal abdominal exam  (+)   Peds negative pediatric ROS (+)  Hematology negative hematology ROS (+)   Anesthesia Other Findings   Reproductive/Obstetrics negative OB ROS                            Anesthesia Physical Anesthesia Plan  ASA: II  Anesthesia Plan: General   Post-op Pain Management:    Induction: Intravenous  PONV Risk Score and Plan: 2 and Ondansetron, Dexamethasone, Midazolam and Treatment may vary due to age or medical condition  Airway Management Planned: LMA  Additional Equipment: None  Intra-op Plan:   Post-operative Plan: Extubation in OR  Informed Consent: I have reviewed the patients History and Physical, chart, labs and discussed the procedure including the risks, benefits and alternatives for the proposed anesthesia with the patient or authorized representative who has indicated his/her understanding and acceptance.     Dental advisory given  Plan Discussed with: CRNA  Anesthesia Plan Comments:         Anesthesia Quick Evaluation

## 2020-03-05 NOTE — Op Note (Signed)
OPERATIVE REPORT  DATE OF SURGERY: 03/05/2020  PATIENT:  Jesse [name],  59 y.o. male  PRE-OPERATIVE DIAGNOSIS:  lymphadenopathy  POST-OPERATIVE DIAGNOSIS:  lymphadenopathy  PROCEDURE:  Procedure(s): EXCISION MASS NECK  SURGEON:  Beckie Salts, MD  ASSISTANTS: None  ANESTHESIA:   General   EBL: 5 ml  DRAINS: None  LOCAL MEDICATIONS USED:  None  SPECIMEN: Right posterior triangle cervical lymph nodes  COUNTS:  Correct  PROCEDURE DETAILS: The patient was taken to the operating room and placed on the operating table in the supine position. Following induction of general endotracheal anesthesia, using laryngeal mask airway, the right neck was prepped and draped in a standard fashion.  A oblique incision was outlined with a marking pen and a cervical skin crease at the posterior border of the sternocleidomastoid muscle.  Electrocautery was used to incise the skin and subcutaneous tissue.  Blunt dissection between the fibers of the posterior sternocleidomastoid muscle was accomplished.  The spinal accessory nerve was felt to be in the anterior aspect of this and was reflected anteriorly.  The lymph node conglomerate was identified deep to the muscle and was removed mostly intact.  There is a total of approximately 3 lymph nodes that were enlarged.  The largest was approximately 3-1/2 cm.  A small vessel was identified and was ligated with a 4-0 silk tie.  The defect was irrigated with saline.  Hemostasis was completed.  Closure was accomplished using interrupted 3-0 chromic on the muscle layer and the subcutaneous layer and then a running subcuticular closure was accomplished.  Dermabond was used on the skin.  The patient was awakened extubated and transferred to recovery in stable condition.      PATIENT DISPOSITION:  To PACU, stable

## 2020-03-06 ENCOUNTER — Encounter: Payer: Self-pay | Admitting: *Deleted

## 2020-03-06 ENCOUNTER — Telehealth: Payer: Self-pay

## 2020-03-06 NOTE — Telephone Encounter (Signed)
  Follow up Call-  Call back number 03/02/2020  Post procedure Call Back phone  # [phone]  Permission to leave phone message Yes  Some recent data might be hidden     Patient questions:  Do you have a fever, pain , or abdominal swelling? No. Pain Score  0 *  Have you tolerated food without any problems? Yes.    Have you been able to return to your normal activities? Yes.    Do you have any questions about your discharge instructions: Diet   No. Medications  No. Follow up visit  No.  Do you have questions or concerns about your Care? No.  Actions: * If pain score is 4 or above: No action needed, pain <4.  1. Have you developed a fever since your procedure? no  2.   Have you had an respiratory symptoms (SOB or cough) since your procedure? no  3.   Have you tested positive for COVID 19 since your procedure no  4.   Have you had any family members/close contacts diagnosed with the COVID 19 since your procedure?  no   If yes to any of these questions please route to Joylene John, RN and Erenest Rasher, RN

## 2020-03-06 NOTE — Telephone Encounter (Signed)
LVM

## 2020-03-07 ENCOUNTER — Encounter: Payer: Self-pay | Admitting: Gastroenterology

## 2020-03-07 LAB — SURGICAL PATHOLOGY

## 2020-03-12 ENCOUNTER — Telehealth: Payer: Self-pay | Admitting: Oncology

## 2020-03-12 NOTE — Telephone Encounter (Signed)
Received a new pt referral from Dr. Izora Gala for dx of CLL. Jesse [name] has been cld and scheduled to see Dr. Benay Spice on 5/5 at 930am. Pt aware to arrive 15 minutes early.

## 2020-03-22 ENCOUNTER — Encounter: Payer: 59 | Admitting: Oncology

## 2020-03-28 ENCOUNTER — Inpatient Hospital Stay: Payer: 59 | Attending: Oncology | Admitting: Oncology

## 2020-03-28 ENCOUNTER — Other Ambulatory Visit: Payer: Self-pay | Admitting: *Deleted

## 2020-03-28 ENCOUNTER — Inpatient Hospital Stay: Payer: 59

## 2020-03-28 ENCOUNTER — Telehealth: Payer: Self-pay | Admitting: Oncology

## 2020-03-28 ENCOUNTER — Other Ambulatory Visit: Payer: Self-pay

## 2020-03-28 VITALS — BP 125/83 | HR 82 | Temp 98.5°F | Resp 17 | Ht 74.0 in | Wt 227.5 lb

## 2020-03-28 DIAGNOSIS — Z803 Family history of malignant neoplasm of breast: Secondary | ICD-10-CM | POA: Diagnosis not present

## 2020-03-28 DIAGNOSIS — E785 Hyperlipidemia, unspecified: Secondary | ICD-10-CM | POA: Insufficient documentation

## 2020-03-28 DIAGNOSIS — Z85828 Personal history of other malignant neoplasm of skin: Secondary | ICD-10-CM | POA: Insufficient documentation

## 2020-03-28 DIAGNOSIS — R590 Localized enlarged lymph nodes: Secondary | ICD-10-CM | POA: Diagnosis not present

## 2020-03-28 DIAGNOSIS — C9110 Chronic lymphocytic leukemia of B-cell type not having achieved remission: Secondary | ICD-10-CM | POA: Diagnosis not present

## 2020-03-28 DIAGNOSIS — Z800 Family history of malignant neoplasm of digestive organs: Secondary | ICD-10-CM | POA: Insufficient documentation

## 2020-03-28 DIAGNOSIS — F419 Anxiety disorder, unspecified: Secondary | ICD-10-CM | POA: Diagnosis not present

## 2020-03-28 DIAGNOSIS — Z8616 Personal history of COVID-19: Secondary | ICD-10-CM | POA: Diagnosis not present

## 2020-03-28 LAB — CBC WITH DIFFERENTIAL (CANCER CENTER ONLY)
Abs Immature Granulocytes: 0.01 10*3/uL (ref 0.00–0.07)
Basophils Absolute: 0.1 10*3/uL (ref 0.0–0.1)
Basophils Relative: 1 %
Eosinophils Absolute: 0.1 10*3/uL (ref 0.0–0.5)
Eosinophils Relative: 2 %
HCT: 42.3 % (ref 39.0–52.0)
Hemoglobin: 14.1 g/dL (ref 13.0–17.0)
Immature Granulocytes: 0 %
Lymphocytes Relative: 38 %
Lymphs Abs: 2.4 10*3/uL (ref 0.7–4.0)
MCH: 29.9 pg (ref 26.0–34.0)
MCHC: 33.3 g/dL (ref 30.0–36.0)
MCV: 89.6 fL (ref 80.0–100.0)
Monocytes Absolute: 0.4 10*3/uL (ref 0.1–1.0)
Monocytes Relative: 6 %
Neutro Abs: 3.3 10*3/uL (ref 1.7–7.7)
Neutrophils Relative %: 53 %
Platelet Count: 171 10*3/uL (ref 150–400)
RBC: 4.72 MIL/uL (ref 4.22–5.81)
RDW: 13.3 % (ref 11.5–15.5)
WBC Count: 6.3 10*3/uL (ref 4.0–10.5)
nRBC: 0 % (ref 0.0–0.2)

## 2020-03-28 LAB — SAVE SMEAR(SSMR), FOR PROVIDER SLIDE REVIEW

## 2020-03-28 NOTE — Progress Notes (Signed)
[facility] New Patient Consult   Requesting MD: Laurey Morale, [place],  [place] [address]   Jaben Skillin 59 y.o.  06-08-1961    Reason for Consult: CLL   HPI: Jesse [name] noted enlarged neck lymph nodes after receiving an influenza vaccine in November of last year.  The lymph nodes decreased in size, but persisted.  He saw Dr. Janace Hoard in January and was noted to have bilateral lymph nodes including 2 cm jugular digastric nodes.  An FNA biopsy revealed scant lymphoid material.  No carcinoma was seen.  No cell block or flow cytometry sample was available.  He saw Dr. Amedeo Plenty for evaluation of shoulder "bursitis ".  Persistent lymphadenopathy was noted.  He was referred to Dr. Constance Holster.  A CT of the neck on 02/28/2020.  Right greater than left parotid space lymph nodes were noted, diffuse bilateral cervical adenopathy.  Small retropharyngeal nodes, and partially visible bilateral axillary lymphadenopathy. He underwent excisional biopsy of right posterior triangle cervical lymph nodes on 03/05/2020.  The pathology revealed chronic lymphocytic leukemia/small lymphocytic lymphoma.  The lymphocytes express CD20, CD5, CD23, and BCL-2.  There is a low proliferation rate.  Flow cytometry confirmed a kappa restricted B-cell population compromising 81% of all lymphocytes.  Mr. Reddinger reports feeling well.     Past Medical History:  Diagnosis Date  . ANXIETY DISORDER, SITUATIONAL, MILD 08/20/2007  . HYPERLIPIDEMIA 08/20/2007   .  COVID-19 infection July 2020  Past Surgical History:  Procedure Laterality Date  . BASAL CELL CARCINOMA EXCISION     per Dr. Jarome Matin   . COLONOSCOPY  03/13/2010   per Dr. Maurene Capes, clear, repeat in 10 yrs   . EXCISION MASS NECK N/A 03/05/2020   Procedure: EXCISION MASS NECK;  Surgeon: Izora Gala, MD;  Location: [facility];  Service: ENT;  Laterality: N/A;  . HERNIA REPAIR     right , ingunial  . KNEE SURGERY    .  LASIK      Medications: Reviewed  Allergies:  Allergies  Allergen Reactions  . Japanese Encephalitis Virus Vaccine     Caused myalgias and swollen lymph nodes     Family history: His mother had pancreas cancer at age 51 and was treated at [facility].  She died 47 years later of an unrelated cause.  She also had a history of breast cancer.  Social History:  He lives in [place].  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  He does not smoke cigarettes.  He reports minimal alcohol use.  He has 3 children.  ROS:   Positives include: Intermittent solid dysphagia after eating rapidly-present for years, palpable neck lymph nodes  A complete ROS was otherwise negative.  Physical Exam:  Blood pressure 125/83, pulse 82, temperature 98.5 F (36.9 C), temperature source Temporal, resp. rate 17, height 6\' 2"  (1.88 m), weight 227 lb 8 oz (103.2 kg), SpO2 98 %.  HEENT: Oropharynx without visible mass, neck without mass Lungs: Clear bilaterally Cardiac: Regular rate and rhythm Abdomen: No hepatosplenomegaly, no mass, nontender Vascular: No leg edema Lymph nodes: Bilateral soft mobile cervical, jugulodigastric, right scalene/supraclavicular, axillary, and inguinal nodes.  The nodes measure from 1-3 cm Neurologic: Alert and oriented, the motor exam appears intact in the upper and lower extremities bilaterally Skin: No rash Musculoskeletal: No spine tenderness   LAB:  CBC  Lab Results  Component Value Date   WBC 6.3 03/28/2020   HGB 14.1 03/28/2020   HCT 42.3  03/28/2020   MCV 89.6 03/28/2020   PLT 171 03/28/2020   NEUTROABS 3.3 03/28/2020     Blood smear: The platelets are normal in number, no platelet clumps.  The red cell morphology is unremarkable.  The polychromasia is not increased.  The majority of the white cells appear as mature neutrophils and lymphocytes.  Few reactive appearing lymphocytes.  No blasts or other young forms are present.  No monotonous white cell population.   CMP   Lab Results  Component Value Date   NA 138 01/10/2020   K 4.2 01/10/2020   CL 103 01/10/2020   CO2 31 01/10/2020   GLUCOSE 100 (H) 01/10/2020   BUN 15 01/10/2020   CREATININE 1.01 01/10/2020   CALCIUM 9.1 01/10/2020   PROT 5.9 (L) 01/10/2020   ALBUMIN 4.2 01/10/2020   AST 13 01/10/2020   ALT 12 01/10/2020   ALKPHOS 47 01/10/2020   BILITOT 0.9 01/10/2020   GFRNONAA 76.28 11/28/2009   GFRAA 93 10/11/2008      Imaging:  CT images from 02/28/2020 reviewed with Mr. Marylen Ponto   Assessment/Plan:   1. Chronic lymphocytic leukemia  Excisional biopsy of right cervical lymph nodes 03/05/2020, kappa restricted B- lymphocytes, CD20, CD5, CD23, and BCL-2 positive.  CT neck 02/28/2020-diffuse bilateral cervical and axillary lymphadenopathy 2. Basal cell carcinoma left shoulder region April 2021 3. Hyperlipidemia 4. COVID-19 infection July 2020   Disposition:   Mr. Busse has been diagnosed with chronic lymphocytic leukemia.  I discussed the prognosis and indications for treatment.  He appears asymptomatic.  Indications for treatment include the development of "the symptoms ", cytopenias, and bothersome lymphadenopathy.  There is no indication for treatment at present.  We will obtain additional diagnostic/prognostic information including quantitative immunoglobulin levels, a CLL molecular prognostic panel, and LDH.  We decided against obtaining further CT scans unless he develops progressive palpable lymphadenopathy or new symptoms.  He understands he is at increased risk of infection.  He will stay up-to-date on the influenza and pneumonia vaccines.  He will seek medical attention for symptoms of an infection.  Mr. Kerwick will return for an office visit and CBC in 3 months.  He will call for new symptoms.  Betsy Coder, MD  03/28/2020, 3:30 PM

## 2020-03-28 NOTE — Telephone Encounter (Signed)
Scheduled appt per 5/5 los - pt aware of appt date and time

## 2020-03-30 ENCOUNTER — Encounter: Payer: Self-pay | Admitting: *Deleted

## 2020-03-30 NOTE — Progress Notes (Signed)
Per Dr. Benay Spice request: Email to [facility] requesting CLL FISH prognostic panel on the biopsy case #[id number] dated 03/05/20.

## 2020-04-18 ENCOUNTER — Telehealth: Payer: Self-pay | Admitting: Oncology

## 2020-04-18 NOTE — Telephone Encounter (Signed)
Unable to reach pt. Left voicemail to give office a call back. Pt wanting to r/s appts on 7/12.

## 2020-04-24 ENCOUNTER — Encounter (HOSPITAL_COMMUNITY): Payer: Self-pay | Admitting: Oncology

## 2020-06-04 ENCOUNTER — Ambulatory Visit: Payer: 59 | Admitting: Oncology

## 2020-06-04 ENCOUNTER — Other Ambulatory Visit: Payer: 59

## 2020-06-06 ENCOUNTER — Other Ambulatory Visit: Payer: Self-pay

## 2020-06-06 ENCOUNTER — Telehealth: Payer: Self-pay | Admitting: Oncology

## 2020-06-06 ENCOUNTER — Inpatient Hospital Stay: Payer: 59 | Admitting: Oncology

## 2020-06-06 ENCOUNTER — Inpatient Hospital Stay: Payer: 59 | Attending: Oncology

## 2020-06-06 VITALS — BP 123/85 | HR 81 | Temp 97.7°F | Resp 17 | Ht 74.0 in | Wt 228.4 lb

## 2020-06-06 DIAGNOSIS — Z8616 Personal history of COVID-19: Secondary | ICD-10-CM | POA: Diagnosis not present

## 2020-06-06 DIAGNOSIS — Z23 Encounter for immunization: Secondary | ICD-10-CM

## 2020-06-06 DIAGNOSIS — E785 Hyperlipidemia, unspecified: Secondary | ICD-10-CM | POA: Diagnosis not present

## 2020-06-06 DIAGNOSIS — C9110 Chronic lymphocytic leukemia of B-cell type not having achieved remission: Secondary | ICD-10-CM | POA: Diagnosis not present

## 2020-06-06 LAB — CBC WITH DIFFERENTIAL (CANCER CENTER ONLY)
Abs Immature Granulocytes: 0.02 10*3/uL (ref 0.00–0.07)
Basophils Absolute: 0 10*3/uL (ref 0.0–0.1)
Basophils Relative: 0 %
Eosinophils Absolute: 0.1 10*3/uL (ref 0.0–0.5)
Eosinophils Relative: 2 %
HCT: 43.1 % (ref 39.0–52.0)
Hemoglobin: 14.5 g/dL (ref 13.0–17.0)
Immature Granulocytes: 0 %
Lymphocytes Relative: 37 %
Lymphs Abs: 2.6 10*3/uL (ref 0.7–4.0)
MCH: 29.3 pg (ref 26.0–34.0)
MCHC: 33.6 g/dL (ref 30.0–36.0)
MCV: 87.1 fL (ref 80.0–100.0)
Monocytes Absolute: 0.5 10*3/uL (ref 0.1–1.0)
Monocytes Relative: 6 %
Neutro Abs: 3.9 10*3/uL (ref 1.7–7.7)
Neutrophils Relative %: 55 %
Platelet Count: 171 10*3/uL (ref 150–400)
RBC: 4.95 MIL/uL (ref 4.22–5.81)
RDW: 13.4 % (ref 11.5–15.5)
WBC Count: 7.2 10*3/uL (ref 4.0–10.5)
nRBC: 0 % (ref 0.0–0.2)

## 2020-06-06 LAB — LACTATE DEHYDROGENASE: LDH: 187 U/L (ref 98–192)

## 2020-06-06 MED ORDER — PNEUMOCOCCAL VAC POLYVALENT 25 MCG/0.5ML IJ INJ: 0.5000 mL | INJECTION | Freq: Once | INTRAMUSCULAR | Status: DC

## 2020-06-06 MED ORDER — PNEUMOCOCCAL 13-VAL CONJ VACC IM SUSP
0.5000 mL | Freq: Once | INTRAMUSCULAR | Status: DC
  Filled 2020-06-06: qty 0.5

## 2020-06-06 NOTE — Progress Notes (Signed)
Called pharmacy to make aware patient will receive Pnuemovax vaccine at out patient pharmacy

## 2020-06-06 NOTE — Telephone Encounter (Signed)
Scheduled per 07/14 los, patient will be notified per My chart. 

## 2020-06-06 NOTE — Progress Notes (Signed)
  [place] [facility] OFFICE PROGRESS NOTE   Diagnosis: CLL  INTERVAL HISTORY:   Mr. Malerba returns as scheduled.  He feels well.  No fever or night sweats.  The palpable lymph nodes are unchanged.  No new complaint.  He continues to have discomfort in the shoulders.  He reports being diagnosed with bursitis by Dr. Amedeo Plenty.  Objective:  Vital signs in last 24 hours:  Blood pressure 123/85, pulse 81, temperature 97.7 F (36.5 C), temperature source Temporal, resp. rate 17, height 6\' 2"  (1.88 m), weight 228 lb 6.4 oz (103.6 kg), SpO2 99 %.    Lymphatics: Soft mobile 1-3 cm bilateral cervical, right supraclavicular, and axillary nodes Resp: Lungs clear bilaterally Cardio: Regular rate and rhythm GI: No hepatosplenomegaly, no mass, nontender Vascular: No leg edema     Lab Results:  Lab Results  Component Value Date   WBC 6.3 03/28/2020   HGB 14.1 03/28/2020   HCT 42.3 03/28/2020   MCV 89.6 03/28/2020   PLT 171 03/28/2020   NEUTROABS 3.3 03/28/2020    CMP  Lab Results  Component Value Date   NA 138 01/10/2020   K 4.2 01/10/2020   CL 103 01/10/2020   CO2 31 01/10/2020   GLUCOSE 100 (H) 01/10/2020   BUN 15 01/10/2020   CREATININE 1.01 01/10/2020   CALCIUM 9.1 01/10/2020   PROT 5.9 (L) 01/10/2020   ALBUMIN 4.2 01/10/2020   AST 13 01/10/2020   ALT 12 01/10/2020   ALKPHOS 47 01/10/2020   BILITOT 0.9 01/10/2020   GFRNONAA 76.28 11/28/2009   GFRAA 93 10/11/2008    Medications: I have reviewed the patient's current medications.   Assessment/Plan: 1. Chronic lymphocytic leukemia  Excisional biopsy of right cervical lymph nodes 03/05/2020, kappa restricted B- lymphocytes, CD20, CD5, CD23, and BCL-2 positive.  CT neck 02/28/2020-diffuse bilateral cervical and axillary lymphadenopathy 2. Basal cell carcinoma left shoulder region April 2021 3. Hyperlipidemia 4. COVID-19 infection July 2020    Disposition: Mr. Fawcett appears stable.  The palpable  lymphadenopathy is unchanged.  He is stable from a hematologic standpoint.  The plan is to continue observation for the CLL.  We obtained a serum immunofixation today.  We are awaiting results from a CLL FISH panel, ordered at the last visit.  The wrong test was submitted by our pathology department and was reordered.  I will follow up on this today.  Mr. Deakins will seek medical attention for symptoms of an infection.  He received the 13 Valent pneumococcal vaccine today.  He will be scheduled for the 23 valent pneumococcal vaccine at the next office visit.  He will return for an office visit in 4 months.  He will contact us in the interim for new symptoms.  Betsy Coder, MD  06/06/2020  11:04 AM

## 2020-06-08 LAB — PROTEIN ELECTROPHORESIS, SERUM
A/G Ratio: 1.9 — ABNORMAL HIGH (ref 0.7–1.7)
Albumin ELP: 4 g/dL (ref 2.9–4.4)
Alpha-1-Globulin: 0.2 g/dL (ref 0.0–0.4)
Alpha-2-Globulin: 0.5 g/dL (ref 0.4–1.0)
Beta Globulin: 0.9 g/dL (ref 0.7–1.3)
Gamma Globulin: 0.5 g/dL (ref 0.4–1.8)
Globulin, Total: 2.1 g/dL — ABNORMAL LOW (ref 2.2–3.9)
M-Spike, %: 0.2 g/dL — ABNORMAL HIGH
Total Protein ELP: 6.1 g/dL (ref 6.0–8.5)

## 2020-06-08 LAB — IMMUNOFIXATION ELECTROPHORESIS
IgA: 66 mg/dL — ABNORMAL LOW (ref 90–386)
IgG (Immunoglobin G), Serum: 491 mg/dL — ABNORMAL LOW (ref 603–1613)
IgM (Immunoglobulin M), Srm: 14 mg/dL — ABNORMAL LOW (ref 20–172)
Total Protein ELP: 6 g/dL (ref 6.0–8.5)

## 2020-06-13 ENCOUNTER — Encounter: Payer: Self-pay | Admitting: *Deleted

## 2020-07-31 ENCOUNTER — Telehealth: Payer: Self-pay | Admitting: *Deleted

## 2020-07-31 NOTE — Telephone Encounter (Signed)
Called at the insistence of his wife to report he has a mild sore throat and sinus congestion. No fever. In no distress. Instructed him to push fluids, take OTC lozenges/decongestant. Call for shortness of breath or high fever.

## 2020-09-27 ENCOUNTER — Inpatient Hospital Stay: Payer: 59 | Attending: Oncology | Admitting: Oncology

## 2020-09-27 ENCOUNTER — Other Ambulatory Visit: Payer: Self-pay

## 2020-09-27 VITALS — BP 112/71 | HR 79 | Temp 98.3°F | Resp 16 | Ht 74.0 in | Wt 221.4 lb

## 2020-09-27 DIAGNOSIS — D801 Nonfamilial hypogammaglobulinemia: Secondary | ICD-10-CM | POA: Insufficient documentation

## 2020-09-27 DIAGNOSIS — Z85828 Personal history of other malignant neoplasm of skin: Secondary | ICD-10-CM | POA: Insufficient documentation

## 2020-09-27 DIAGNOSIS — E785 Hyperlipidemia, unspecified: Secondary | ICD-10-CM | POA: Insufficient documentation

## 2020-09-27 DIAGNOSIS — Z8616 Personal history of COVID-19: Secondary | ICD-10-CM | POA: Insufficient documentation

## 2020-09-27 DIAGNOSIS — Z23 Encounter for immunization: Secondary | ICD-10-CM | POA: Diagnosis not present

## 2020-09-27 DIAGNOSIS — C9110 Chronic lymphocytic leukemia of B-cell type not having achieved remission: Secondary | ICD-10-CM | POA: Insufficient documentation

## 2020-09-27 MED ORDER — PNEUMOCOCCAL VAC POLYVALENT 25 MCG/0.5ML IJ INJ
0.5000 mL | INJECTION | Freq: Once | INTRAMUSCULAR | Status: AC
  Administered 2020-09-27: 0.5 mL via INTRAMUSCULAR
  Filled 2020-09-27: qty 0.5

## 2020-09-27 NOTE — Progress Notes (Signed)
  [facility] OFFICE PROGRESS NOTE   Diagnosis: CLL  INTERVAL HISTORY:   Jesse [name] returns as scheduled.  He feels well.  No fever or night sweats.  No change in the palpable lymph nodes.  He reports intentional weight loss with diet and exercise.  No complaint.  Objective:  Vital signs in last 24 hours:  Blood pressure 112/71, pulse 79, temperature 98.3 F (36.8 C), temperature source Tympanic, resp. rate 16, height $RemoveBe'6\' 2"'MBEiGCIbM$  (1.88 m), weight 221 lb 6.4 oz (100.4 kg), SpO2 98 %.    Lymphatics: Soft mobile bilateral cervical, supraclavicular, axillary, and inguinal nodes.  The largest nodes are a 2-3 cm left low cervical/supraclavicular and bilateral 2-3 cm axillary nodes Resp: Lungs clear bilaterally Cardio: Regular rate and rhythm GI: No mass, nontender, no hepatosplenomegaly Vascular: No leg edema    Lab Results:  Lab Results  Component Value Date   WBC 7.2 06/06/2020   HGB 14.5 06/06/2020   HCT 43.1 06/06/2020   MCV 87.1 06/06/2020   PLT 171 06/06/2020   NEUTROABS 3.9 06/06/2020    CMP  Lab Results  Component Value Date   NA 138 01/10/2020   K 4.2 01/10/2020   CL 103 01/10/2020   CO2 31 01/10/2020   GLUCOSE 100 (H) 01/10/2020   BUN 15 01/10/2020   CREATININE 1.01 01/10/2020   CALCIUM 9.1 01/10/2020   PROT 5.9 (L) 01/10/2020   ALBUMIN 4.2 01/10/2020   AST 13 01/10/2020   ALT 12 01/10/2020   ALKPHOS 47 01/10/2020   BILITOT 0.9 01/10/2020   GFRNONAA 76.28 11/28/2009   GFRAA 93 10/11/2008     Medications: I have reviewed the patient's current medications.   Assessment/Plan: 1. Chronic lymphocytic leukemia  Excisional biopsy of right cervical lymph nodes 03/05/2020, kappa restricted B- lymphocytes, CD20, CD5, CD23, and BCL-2 positive.  FISH panel-trisomy 12, negative for loss of p53  CT neck 02/28/2020-diffuse bilateral cervical and axillary lymphadenopathy 2. Basal cell carcinoma left shoulder region April  2021 3. Hyperlipidemia 4. 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  He has been diagnosed with CLL.  He is asymptomatic.  There is no indication for treating the CLL at present.  He has associated hypogammaglobulinemia and a low level serum M spike.  He received the 13 Valent pneumococcal vaccine was he was here in July.  He received the 23 valent pneumococcal vaccine today. [name] will return for an office and lab visit in 4 months.  Betsy Coder, MD  09/27/2020  1:49 PM

## 2020-11-26 DIAGNOSIS — L570 Actinic keratosis: Secondary | ICD-10-CM | POA: Diagnosis not present

## 2020-11-26 DIAGNOSIS — C4442 Squamous cell carcinoma of skin of scalp and neck: Secondary | ICD-10-CM | POA: Diagnosis not present

## 2020-11-27 DIAGNOSIS — Z20822 Contact with and (suspected) exposure to covid-19: Secondary | ICD-10-CM | POA: Diagnosis not present

## 2020-11-30 ENCOUNTER — Other Ambulatory Visit: Payer: Self-pay

## 2020-11-30 DIAGNOSIS — Z20822 Contact with and (suspected) exposure to covid-19: Secondary | ICD-10-CM

## 2020-12-04 LAB — NOVEL CORONAVIRUS, NAA: SARS-CoV-2, NAA: DETECTED — AB

## 2020-12-05 ENCOUNTER — Telehealth: Payer: Self-pay

## 2020-12-05 NOTE — Telephone Encounter (Signed)
Contacted pt. In regard to COVID 19 infusion therapy. States he is asymptomatic.

## 2020-12-21 DIAGNOSIS — F10929 Alcohol use, unspecified with intoxication, unspecified: Secondary | ICD-10-CM | POA: Diagnosis not present

## 2020-12-21 DIAGNOSIS — R112 Nausea with vomiting, unspecified: Secondary | ICD-10-CM | POA: Diagnosis not present

## 2020-12-21 DIAGNOSIS — R1111 Vomiting without nausea: Secondary | ICD-10-CM | POA: Diagnosis not present

## 2020-12-24 DIAGNOSIS — C4442 Squamous cell carcinoma of skin of scalp and neck: Secondary | ICD-10-CM | POA: Diagnosis not present

## 2020-12-27 ENCOUNTER — Other Ambulatory Visit: Payer: Self-pay | Admitting: Family Medicine

## 2020-12-28 ENCOUNTER — Other Ambulatory Visit: Payer: Self-pay | Admitting: Family Medicine

## 2020-12-28 NOTE — Telephone Encounter (Signed)
Labs required for future refills

## 2021-01-09 ENCOUNTER — Other Ambulatory Visit: Payer: Self-pay

## 2021-01-10 ENCOUNTER — Encounter: Payer: Self-pay | Admitting: Family Medicine

## 2021-01-10 ENCOUNTER — Ambulatory Visit (INDEPENDENT_AMBULATORY_CARE_PROVIDER_SITE_OTHER): Payer: BC Managed Care – PPO | Admitting: Family Medicine

## 2021-01-10 VITALS — BP 118/80 | HR 76 | Temp 98.0°F | Ht 74.0 in | Wt 223.0 lb

## 2021-01-10 DIAGNOSIS — Z0000 Encounter for general adult medical examination without abnormal findings: Secondary | ICD-10-CM

## 2021-01-10 LAB — HEPATIC FUNCTION PANEL
ALT: 15 U/L (ref 0–53)
AST: 15 U/L (ref 0–37)
Albumin: 4.4 g/dL (ref 3.5–5.2)
Alkaline Phosphatase: 51 U/L (ref 39–117)
Bilirubin, Direct: 0.2 mg/dL (ref 0.0–0.3)
Total Bilirubin: 0.8 mg/dL (ref 0.2–1.2)
Total Protein: 6.2 g/dL (ref 6.0–8.3)

## 2021-01-10 LAB — CBC WITH DIFFERENTIAL/PLATELET
Basophils Absolute: 0 10*3/uL (ref 0.0–0.1)
Basophils Relative: 0.7 % (ref 0.0–3.0)
Eosinophils Absolute: 0.2 10*3/uL (ref 0.0–0.7)
Eosinophils Relative: 4.1 % (ref 0.0–5.0)
HCT: 44.4 % (ref 39.0–52.0)
Hemoglobin: 15 g/dL (ref 13.0–17.0)
Lymphocytes Relative: 25.7 % (ref 12.0–46.0)
Lymphs Abs: 1.5 10*3/uL (ref 0.7–4.0)
MCHC: 33.7 g/dL (ref 30.0–36.0)
MCV: 86.7 fl (ref 78.0–100.0)
Monocytes Absolute: 0.4 10*3/uL (ref 0.1–1.0)
Monocytes Relative: 6.8 % (ref 3.0–12.0)
Neutro Abs: 3.6 10*3/uL (ref 1.4–7.7)
Neutrophils Relative %: 62.7 % (ref 43.0–77.0)
Platelets: 180 10*3/uL (ref 150.0–400.0)
RBC: 5.13 Mil/uL (ref 4.22–5.81)
RDW: 14.4 % (ref 11.5–15.5)
WBC: 5.7 10*3/uL (ref 4.0–10.5)

## 2021-01-10 LAB — LIPID PANEL
Cholesterol: 173 mg/dL (ref 0–200)
HDL: 60.7 mg/dL (ref >39.00)
LDL Cholesterol: 102 mg/dL — ABNORMAL HIGH (ref 0–99)
NonHDL: 112.53
Total CHOL/HDL Ratio: 3
Triglycerides: 53 mg/dL (ref 0.0–149.0)
VLDL: 10.6 mg/dL (ref 0.0–40.0)

## 2021-01-10 LAB — BASIC METABOLIC PANEL
BUN: 14 mg/dL (ref 6–23)
CO2: 31 mEq/L (ref 19–32)
Calcium: 9.4 mg/dL (ref 8.4–10.5)
Chloride: 105 mEq/L (ref 96–112)
Creatinine, Ser: 1.15 mg/dL (ref 0.40–1.50)
GFR: 70.38 mL/min (ref >60.00)
Glucose, Bld: 113 mg/dL — ABNORMAL HIGH (ref 70–99)
Potassium: 4.3 mEq/L (ref 3.5–5.1)
Sodium: 140 mEq/L (ref 135–145)

## 2021-01-10 LAB — PSA: PSA: 1.09 ng/mL (ref 0.10–4.00)

## 2021-01-10 LAB — TSH: TSH: 3.72 u[IU]/mL (ref 0.35–4.50)

## 2021-01-10 MED ORDER — SIMVASTATIN 40 MG PO TABS: ORAL_TABLET | ORAL | 3 refills | Status: DC

## 2021-01-10 NOTE — Progress Notes (Signed)
Subjective:    Patient ID: Jesse [name], male    DOB: 04-27-1961, 60 y.o.   MRN: [id number]  HPI Here for a well exam. He feels well except for a sensation of water in the left ear. This began about a month ago. No trouble hearing and no pain. Since we saw him last he has been diagnosed with CLL. He sees Dr. Benay Spice. This seems to be under control and does not require treatment at this point.   Review of Systems  Constitutional: Negative.   HENT: Negative.   Eyes: Negative.   Respiratory: Negative.   Cardiovascular: Negative.   Gastrointestinal: Negative.   Genitourinary: Negative.   Musculoskeletal: Negative.   Skin: Negative.   Neurological: Negative.   Psychiatric/Behavioral: Negative.        Objective:   Physical Exam Constitutional:      General: He is not in acute distress.    Appearance: He is well-developed and well-nourished. He is not diaphoretic.  HENT:     Head: Normocephalic and atraumatic.     Right Ear: External ear normal.     Left Ear: External ear normal.     Nose: Nose normal.     Mouth/Throat:     Mouth: Oropharynx is clear and moist.     Pharynx: No oropharyngeal exudate.  Eyes:     General: No scleral icterus.       Right eye: No discharge.        Left eye: No discharge.     Extraocular Movements: EOM normal.     Conjunctiva/sclera: Conjunctivae normal.     Pupils: Pupils are equal, round, and reactive to light.  Neck:     Thyroid: No thyromegaly.     Vascular: No JVD.     Trachea: No tracheal deviation.  Cardiovascular:     Rate and Rhythm: Normal rate and regular rhythm.     Pulses: Intact distal pulses.     Heart sounds: Normal heart sounds. No murmur heard. No friction rub. No gallop.   Pulmonary:     Effort: Pulmonary effort is normal. No respiratory distress.     Breath sounds: Normal breath sounds. No wheezing or rales.  Chest:     Chest wall: No tenderness.  Abdominal:     General: Bowel sounds are normal. There is no  distension.     Palpations: Abdomen is soft. There is no mass.     Tenderness: There is no abdominal tenderness. There is no guarding or rebound.  Genitourinary:    Penis: Normal. No tenderness.      Testes: Normal.     Prostate: Normal.     Rectum: Normal. Guaiac result negative.  Musculoskeletal:        General: No tenderness or edema. Normal range of motion.     Cervical back: Neck supple.  Lymphadenopathy:     Cervical: No cervical adenopathy.  Skin:    General: Skin is warm and dry.     Coloration: Skin is not pale.     Findings: No erythema or rash.  Neurological:     Mental Status: He is alert and oriented to person, place, and time.     Cranial Nerves: No cranial nerve deficit.     Motor: No abnormal muscle tone.     Coordination: Coordination normal.     Deep Tendon Reflexes: Reflexes are normal and symmetric. Reflexes normal.  Psychiatric:        Mood and Affect: Mood and affect normal.  Behavior: Behavior normal.        Thought Content: Thought content normal.        Judgment: Judgment normal.           Assessment & Plan:  Well exam. We discussed diet and exercise. Get fasting labs. He seems to have some eustachian tube dysfunction, so I suggested her try Flonase daily for a few weeks.  Alysia Penna, MD

## 2021-01-14 DIAGNOSIS — M7541 Impingement syndrome of right shoulder: Secondary | ICD-10-CM | POA: Diagnosis not present

## 2021-01-14 DIAGNOSIS — M7542 Impingement syndrome of left shoulder: Secondary | ICD-10-CM | POA: Diagnosis not present

## 2021-01-24 ENCOUNTER — Other Ambulatory Visit: Payer: Self-pay | Admitting: *Deleted

## 2021-01-24 ENCOUNTER — Other Ambulatory Visit: Payer: Self-pay

## 2021-01-24 ENCOUNTER — Inpatient Hospital Stay: Payer: BC Managed Care – PPO | Admitting: Oncology

## 2021-01-24 ENCOUNTER — Inpatient Hospital Stay: Payer: BC Managed Care – PPO | Attending: Oncology

## 2021-01-24 VITALS — BP 128/85 | HR 73 | Temp 98.0°F | Resp 18 | Ht 73.0 in | Wt 221.5 lb

## 2021-01-24 DIAGNOSIS — Z85828 Personal history of other malignant neoplasm of skin: Secondary | ICD-10-CM | POA: Diagnosis not present

## 2021-01-24 DIAGNOSIS — C9110 Chronic lymphocytic leukemia of B-cell type not having achieved remission: Secondary | ICD-10-CM

## 2021-01-24 DIAGNOSIS — Z8616 Personal history of COVID-19: Secondary | ICD-10-CM | POA: Insufficient documentation

## 2021-01-24 DIAGNOSIS — D801 Nonfamilial hypogammaglobulinemia: Secondary | ICD-10-CM | POA: Diagnosis not present

## 2021-01-24 DIAGNOSIS — R0981 Nasal congestion: Secondary | ICD-10-CM | POA: Insufficient documentation

## 2021-01-24 DIAGNOSIS — E785 Hyperlipidemia, unspecified: Secondary | ICD-10-CM | POA: Insufficient documentation

## 2021-01-24 LAB — CBC WITH DIFFERENTIAL (CANCER CENTER ONLY)
Abs Immature Granulocytes: 0.08 10*3/uL — ABNORMAL HIGH (ref 0.00–0.07)
Basophils Absolute: 0 10*3/uL (ref 0.0–0.1)
Basophils Relative: 0 %
Eosinophils Absolute: 0 10*3/uL (ref 0.0–0.5)
Eosinophils Relative: 0 %
HCT: 43.1 % (ref 39.0–52.0)
Hemoglobin: 15 g/dL (ref 13.0–17.0)
Immature Granulocytes: 1 %
Lymphocytes Relative: 18 %
Lymphs Abs: 2.2 10*3/uL (ref 0.7–4.0)
MCH: 29.8 pg (ref 26.0–34.0)
MCHC: 34.8 g/dL (ref 30.0–36.0)
MCV: 85.7 fL (ref 80.0–100.0)
Monocytes Absolute: 0.9 10*3/uL (ref 0.1–1.0)
Monocytes Relative: 7 %
Neutro Abs: 8.8 10*3/uL — ABNORMAL HIGH (ref 1.7–7.7)
Neutrophils Relative %: 74 %
Platelet Count: 170 10*3/uL (ref 150–400)
RBC: 5.03 MIL/uL (ref 4.22–5.81)
RDW: 14.1 % (ref 11.5–15.5)
WBC Count: 12 10*3/uL — ABNORMAL HIGH (ref 4.0–10.5)
nRBC: 0 % (ref 0.0–0.2)

## 2021-01-24 LAB — CMP (CANCER CENTER ONLY)
ALT: 11 U/L (ref 0–44)
AST: 11 U/L — ABNORMAL LOW (ref 15–41)
Albumin: 4.1 g/dL (ref 3.5–5.0)
Alkaline Phosphatase: 49 U/L (ref 38–126)
Anion gap: 9 (ref 5–15)
BUN: 26 mg/dL — ABNORMAL HIGH (ref 6–20)
CO2: 23 mmol/L (ref 22–32)
Calcium: 9.2 mg/dL (ref 8.9–10.3)
Chloride: 106 mmol/L (ref 98–111)
Creatinine: 1.06 mg/dL (ref 0.61–1.24)
GFR, Estimated: >60 mL/min (ref >60)
Glucose, Bld: 105 mg/dL — ABNORMAL HIGH (ref 70–99)
Potassium: 4.3 mmol/L (ref 3.5–5.1)
Sodium: 138 mmol/L (ref 135–145)
Total Bilirubin: 0.6 mg/dL (ref 0.3–1.2)
Total Protein: 6.3 g/dL — ABNORMAL LOW (ref 6.5–8.1)

## 2021-01-24 NOTE — Progress Notes (Signed)
  [place] [facility] OFFICE PROGRESS NOTE   Diagnosis: CLL  INTERVAL HISTORY:   Jesse [name] returns for a scheduled visit.  He feels well.  No fever or night sweats.  Good appetite.  He is exercising.  He reports intentional weight loss.  He was diagnosed with COVID-19 in December 2021.  He has pain in his left shoulder and is followed by orthopedics.  He is scheduled for an MRI.  He has received steroid injections at the left shoulder, most recently 1 week ago.  Jesse [name] is completing a steroid taper for congestion at the left ear.  He also has sinus congestion beginning this morning.  Objective:  Vital signs in last 24 hours:  Blood pressure 128/85, pulse 73, temperature 98 F (36.7 C), temperature source Tympanic, resp. rate (!) 73, height $RemoveBe'6\' 1"'APBgkaHRo$  (1.854 m), weight 221 lb 8 oz (100.5 kg), SpO2 98 %.    HEENT: No sinus tenderness Lymphatics: Less than 1 cm left posterior scalene node, no other palpable neck nodes.  1 cm mobile bilateral axillary nodes.  No inguinal nodes. Resp: Lungs clear bilaterally Cardio: Regular rate and rhythm GI: No hepatosplenomegaly, no mass, nontender Vascular: No leg edema   Lab Results:  Lab Results  Component Value Date   WBC 12.0 (H) 01/24/2021   HGB 15.0 01/24/2021   HCT 43.1 01/24/2021   MCV 85.7 01/24/2021   PLT 170 01/24/2021   NEUTROABS 8.8 (H) 01/24/2021    CMP  Lab Results  Component Value Date   NA 140 01/10/2021   K 4.3 01/10/2021   CL 105 01/10/2021   CO2 31 01/10/2021   GLUCOSE 113 (H) 01/10/2021   BUN 14 01/10/2021   CREATININE 1.15 01/10/2021   CALCIUM 9.4 01/10/2021   PROT 6.2 01/10/2021   ALBUMIN 4.4 01/10/2021   AST 15 01/10/2021   ALT 15 01/10/2021   ALKPHOS 51 01/10/2021   BILITOT 0.8 01/10/2021   GFRNONAA 76.28 11/28/2009   GFRAA 93 10/11/2008    Medications: I have reviewed the patient's current medications.   Assessment/Plan: 1. Chronic lymphocytic leukemia  Excisional biopsy of right  cervical lymph nodes 03/05/2020, kappa restricted B- lymphocytes, CD20, CD5, CD23, and BCL-2 positive.  FISH panel-trisomy 12, negative for loss of p53  CT neck 02/28/2020-diffuse bilateral cervical and axillary lymphadenopathy 2. Basal cell carcinoma left shoulder region April 2021 3. Hyperlipidemia 4. 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  He appears asymptomatic from the CLL.  The plan is to continue observation.  He will return for an office visit and CBC in 6 months.  He has neutrophilia today.  This is likely related to the recent steroid injection and oral steroids.  The palpable lymph nodes are smaller today, likely secondary to recent steroid therapy.  We discussed Evusheld.  He will be referred to consider treatment with Evusheld.  Betsy Coder, MD  01/24/2021  12:51 PM

## 2021-01-24 NOTE — Progress Notes (Signed)
Referral placed for Evusheld per Dr. Benay Spice request.

## 2021-01-28 ENCOUNTER — Telehealth: Payer: Self-pay | Admitting: Oncology

## 2021-01-28 NOTE — Telephone Encounter (Signed)
Scheduled appointments per 3/3 los. Called patient, no answer. Left message with appointments date and times.

## 2021-02-09 DIAGNOSIS — M25511 Pain in right shoulder: Secondary | ICD-10-CM | POA: Diagnosis not present

## 2021-02-09 DIAGNOSIS — M25512 Pain in left shoulder: Secondary | ICD-10-CM | POA: Diagnosis not present

## 2021-02-11 ENCOUNTER — Encounter: Payer: Self-pay | Admitting: Adult Health

## 2021-02-11 ENCOUNTER — Telehealth: Payer: Self-pay | Admitting: Adult Health

## 2021-02-11 ENCOUNTER — Other Ambulatory Visit: Payer: Self-pay | Admitting: Adult Health

## 2021-02-11 DIAGNOSIS — C9110 Chronic lymphocytic leukemia of B-cell type not having achieved remission: Secondary | ICD-10-CM

## 2021-02-11 NOTE — Progress Notes (Signed)
I connected by phone with Jesse [name] on 02/11/2021, 9:46 AM to discuss the potential use of a new treatment, tixagevimab/cilgavimab, for pre-exposure prophylaxis for prevention of coronavirus disease 2019 (COVID-19) caused by the SARS-CoV-2 virus.  This patient is a 60 y.o. male that meets the FDA criteria for Emergency Use Authorization of tixagevimab/cilgavimab for pre-exposure prophylaxis of COVID-19 disease. Pt meets following criteria:  Age >12 yr and weight > 40kg  Not currently infected with SARS-CoV-2 and has no known recent exposure to an individual infected with SARS-CoV-2 AND o Who has moderate to severe immune compromise due to a medical condition or receipt of immunosuppressive medications or treatments and may not mount an adequate immune response to COVID-19 vaccination or  o Vaccination with any available COVID-19 vaccine, according to the approved or authorized schedule, is not recommended due to a history of severe adverse reaction (e.g., severe allergic reaction) to a COVID-19 vaccine(s) and/or COVID-19 vaccine component(s).  o Patient meets the following definition of mod-severe immune compromised status: 6. Other actively treated hematologic malignancies or severe congenital immunodeficiency syndromes  I have spoken and communicated the following to the patient or parent/caregiver regarding COVID monoclonal antibody treatment:  1. FDA has authorized the emergency use of tixagevimab/cilgavimab for the pre-exposure prophylaxis of COVID-19 in patients with moderate-severe immunocompromised status, who meet above EUA criteria.  2. The significant known and potential risks and benefits of COVID monoclonal antibody, and the extent to which such potential risks and benefits are unknown.  3. Information on available alternative treatments and the risks and benefits of those alternatives, including clinical trials.  4. The patient or parent/caregiver has the option to accept or refuse  COVID monoclonal antibody treatment.  After reviewing this information with the patient, agree to receive tixagevimab/cilgavimab.  Patient to come in tomorrow, 02/12/2021 at [facility] to receive the Evusheld at 2pm.  High priority message sent.    Scot Dock, NP, 02/11/2021, 9:46 AM

## 2021-02-11 NOTE — Telephone Encounter (Signed)
Scheduled appt per 3/21 sch msg. Patient is already aware

## 2021-02-12 ENCOUNTER — Inpatient Hospital Stay: Payer: BC Managed Care – PPO

## 2021-02-12 ENCOUNTER — Other Ambulatory Visit: Payer: Self-pay

## 2021-02-12 DIAGNOSIS — E785 Hyperlipidemia, unspecified: Secondary | ICD-10-CM | POA: Diagnosis not present

## 2021-02-12 DIAGNOSIS — Z85828 Personal history of other malignant neoplasm of skin: Secondary | ICD-10-CM | POA: Diagnosis not present

## 2021-02-12 DIAGNOSIS — Z8616 Personal history of COVID-19: Secondary | ICD-10-CM | POA: Diagnosis not present

## 2021-02-12 DIAGNOSIS — C9110 Chronic lymphocytic leukemia of B-cell type not having achieved remission: Secondary | ICD-10-CM | POA: Diagnosis not present

## 2021-02-12 DIAGNOSIS — R0981 Nasal congestion: Secondary | ICD-10-CM | POA: Diagnosis not present

## 2021-02-12 DIAGNOSIS — D801 Nonfamilial hypogammaglobulinemia: Secondary | ICD-10-CM | POA: Diagnosis not present

## 2021-02-12 MED ORDER — TIXAGEVIMAB (PART OF EVUSHELD) INJECTION
300.0000 mg | Freq: Once | INTRAMUSCULAR | Status: AC
  Administered 2021-02-12: 300 mg via INTRAMUSCULAR
  Filled 2021-02-12: qty 3

## 2021-02-12 MED ORDER — CILGAVIMAB (PART OF EVUSHELD) INJECTION
300.0000 mg | Freq: Once | INTRAMUSCULAR | Status: AC
  Administered 2021-02-12: 300 mg via INTRAMUSCULAR
  Filled 2021-02-12: qty 3

## 2021-02-12 NOTE — Patient Instructions (Signed)
Preventing Disease Through Immunization Immunization means developing a lower risk of getting a disease due to improvements in the body's disease-fighting system (immune system). Immunization can happen through:  Natural exposure to a disease.  Getting shots (vaccination). Vaccination involves putting a small amount of germs (vaccines) into the body. This may be done through one or more shots. Some vaccines can be given by mouth or as a nasal spray, instead of a shot. Vaccination helps to prevent:  Serious diseases such as polio, measles, and whooping cough.  Common infections, such as the flu. Vaccination starts at birth. Teens and adults also need vaccines regularly. Talk with your health care provider about the immunization schedule that is best for you. Some vaccines need to be repeated when you are older. How does immunization prevent disease? Immunization occurs when the body is exposed to germs that cause a certain disease. The body responds to this exposure by forming certain proteins, called antibodies, to fight those germs. Germs in vaccines are dead or very weak, so they will not make you sick. However, the antibodies that your body makes will stay in your body for a long time. This improves the ability of your immune system to fight the germs in the future. If you get exposed to the germs again, you may be able to resist them because you have developed immunity against them. This is because your antibodies may destroy the germs before you get sick. Why should I prevent diseases through vaccination? Vaccines can protect you from getting diseases that can cause harmful complications and even death. Getting vaccinated also helps to keep other people healthy. If you are vaccinated, you cannot spread disease to others, and that can make the disease become less common. If people keep getting vaccinated, certain diseases may become rare or go away. If people stop getting vaccinated, certain  diseases could become more common. Not everyone can get a vaccine. Very young babies, people who are very sick, or older people may not be able to get vaccines. By getting immunized, you help to protect people who are not able to be vaccinated. Where to find more information To learn more about immunization, visit:  World Health Organization: https://www.davis-walter.com/  Centers for Disease Control and Prevention: http://www.murphy.com/ Summary  Immunization occurs when the body is exposed to germs that cause a certain disease and responds by forming proteins (antibodies) to fight those germs.  Getting vaccines is a safe and good way to develop immunity against specific germs and the diseases that they cause.  Talk with your health care provider about your immunization schedule, and stay up to date with all of your shots. This information is not intended to replace advice given to you by your health care provider. Make sure you discuss any questions you have with your health care provider. Document Revised: 10/13/2019 Document Reviewed: 10/13/2019 Elsevier Patient Education  2021 [employer].

## 2021-02-13 DIAGNOSIS — D2262 Melanocytic nevi of left upper limb, including shoulder: Secondary | ICD-10-CM | POA: Diagnosis not present

## 2021-02-13 DIAGNOSIS — L570 Actinic keratosis: Secondary | ICD-10-CM | POA: Diagnosis not present

## 2021-02-13 DIAGNOSIS — M7541 Impingement syndrome of right shoulder: Secondary | ICD-10-CM | POA: Diagnosis not present

## 2021-02-13 DIAGNOSIS — Z85828 Personal history of other malignant neoplasm of skin: Secondary | ICD-10-CM | POA: Diagnosis not present

## 2021-02-13 DIAGNOSIS — M7542 Impingement syndrome of left shoulder: Secondary | ICD-10-CM | POA: Diagnosis not present

## 2021-02-13 DIAGNOSIS — D225 Melanocytic nevi of trunk: Secondary | ICD-10-CM | POA: Diagnosis not present

## 2021-02-13 DIAGNOSIS — D2261 Melanocytic nevi of right upper limb, including shoulder: Secondary | ICD-10-CM | POA: Diagnosis not present

## 2021-03-01 DIAGNOSIS — Z20822 Contact with and (suspected) exposure to covid-19: Secondary | ICD-10-CM | POA: Diagnosis not present

## 2021-06-24 DIAGNOSIS — M19012 Primary osteoarthritis, left shoulder: Secondary | ICD-10-CM | POA: Diagnosis not present

## 2021-06-24 DIAGNOSIS — M25511 Pain in right shoulder: Secondary | ICD-10-CM | POA: Diagnosis not present

## 2021-06-24 DIAGNOSIS — M25512 Pain in left shoulder: Secondary | ICD-10-CM | POA: Diagnosis not present

## 2021-07-30 ENCOUNTER — Other Ambulatory Visit: Payer: BC Managed Care – PPO

## 2021-07-30 ENCOUNTER — Ambulatory Visit: Payer: BC Managed Care – PPO | Admitting: Nurse Practitioner

## 2021-08-06 ENCOUNTER — Other Ambulatory Visit: Payer: Self-pay | Admitting: Nurse Practitioner

## 2021-08-06 ENCOUNTER — Inpatient Hospital Stay: Payer: BC Managed Care – PPO | Attending: Nurse Practitioner

## 2021-08-06 ENCOUNTER — Inpatient Hospital Stay: Payer: BC Managed Care – PPO

## 2021-08-06 ENCOUNTER — Other Ambulatory Visit: Payer: Self-pay

## 2021-08-06 ENCOUNTER — Inpatient Hospital Stay: Payer: BC Managed Care – PPO | Admitting: Oncology

## 2021-08-06 VITALS — BP 137/84 | HR 79 | Temp 97.8°F | Resp 18 | Ht 73.0 in | Wt 226.6 lb

## 2021-08-06 DIAGNOSIS — Z8616 Personal history of COVID-19: Secondary | ICD-10-CM | POA: Insufficient documentation

## 2021-08-06 DIAGNOSIS — D801 Nonfamilial hypogammaglobulinemia: Secondary | ICD-10-CM | POA: Insufficient documentation

## 2021-08-06 DIAGNOSIS — C9110 Chronic lymphocytic leukemia of B-cell type not having achieved remission: Secondary | ICD-10-CM

## 2021-08-06 DIAGNOSIS — E785 Hyperlipidemia, unspecified: Secondary | ICD-10-CM | POA: Diagnosis not present

## 2021-08-06 DIAGNOSIS — Z85828 Personal history of other malignant neoplasm of skin: Secondary | ICD-10-CM | POA: Diagnosis not present

## 2021-08-06 DIAGNOSIS — Z298 Encounter for other specified prophylactic measures: Secondary | ICD-10-CM | POA: Insufficient documentation

## 2021-08-06 DIAGNOSIS — Z79899 Other long term (current) drug therapy: Secondary | ICD-10-CM | POA: Insufficient documentation

## 2021-08-06 LAB — CBC WITH DIFFERENTIAL (CANCER CENTER ONLY)
Abs Immature Granulocytes: 0.03 10*3/uL (ref 0.00–0.07)
Basophils Absolute: 0 10*3/uL (ref 0.0–0.1)
Basophils Relative: 1 %
Eosinophils Absolute: 0.2 10*3/uL (ref 0.0–0.5)
Eosinophils Relative: 3 %
HCT: 44.5 % (ref 39.0–52.0)
Hemoglobin: 15.2 g/dL (ref 13.0–17.0)
Immature Granulocytes: 1 %
Lymphocytes Relative: 20 %
Lymphs Abs: 1.2 10*3/uL (ref 0.7–4.0)
MCH: 29.4 pg (ref 26.0–34.0)
MCHC: 34.2 g/dL (ref 30.0–36.0)
MCV: 86.1 fL (ref 80.0–100.0)
Monocytes Absolute: 0.4 10*3/uL (ref 0.1–1.0)
Monocytes Relative: 7 %
Neutro Abs: 4.1 10*3/uL (ref 1.7–7.7)
Neutrophils Relative %: 68 %
Platelet Count: 165 10*3/uL (ref 150–400)
RBC: 5.17 MIL/uL (ref 4.22–5.81)
RDW: 13.7 % (ref 11.5–15.5)
WBC Count: 5.9 10*3/uL (ref 4.0–10.5)
nRBC: 0 % (ref 0.0–0.2)

## 2021-08-06 MED ORDER — CILGAVIMAB (PART OF EVUSHELD) INJECTION
300.0000 mg | Freq: Once | INTRAMUSCULAR | Status: AC
  Administered 2021-08-06: 300 mg via INTRAMUSCULAR

## 2021-08-06 MED ORDER — TIXAGEVIMAB (PART OF EVUSHELD) INJECTION
300.0000 mg | Freq: Once | INTRAMUSCULAR | Status: AC
  Administered 2021-08-06: 300 mg via INTRAMUSCULAR

## 2021-08-06 NOTE — Patient Instructions (Signed)
[facility]  Discharge Instructions: Thank you for choosing [facility] to provide your oncology and hematology care.   If you have a lab appointment with the [facility], please go directly to the [facility] and check in at the registration area.   Wear comfortable clothing and clothing appropriate for easy access to any Portacath or PICC line.   We strive to give you quality time with your provider. You may need to reschedule your appointment if you arrive late (15 or more minutes).  Arriving late affects you and other patients whose appointments are after yours.  Also, if you miss three or more appointments without notifying the office, you may be dismissed from the clinic at the provider's discretion.      For prescription refill requests, have your pharmacy contact our office and allow 72 hours for refills to be completed.    Today you received the following chemotherapy and/or immunotherapy agents EVOSHIELD      To help prevent nausea and vomiting after your treatment, we encourage you to take your nausea medication as directed.  BELOW ARE SYMPTOMS THAT SHOULD BE REPORTED IMMEDIATELY: *FEVER GREATER THAN 100.4 F (38 C) OR HIGHER *CHILLS OR SWEATING *NAUSEA AND VOMITING THAT IS NOT CONTROLLED WITH YOUR NAUSEA MEDICATION *UNUSUAL SHORTNESS OF BREATH *UNUSUAL BRUISING OR BLEEDING *URINARY PROBLEMS (pain or burning when urinating, or frequent urination) *BOWEL PROBLEMS (unusual diarrhea, constipation, pain near the anus) TENDERNESS IN MOUTH AND THROAT WITH OR WITHOUT PRESENCE OF ULCERS (sore throat, sores in mouth, or a toothache) UNUSUAL RASH, SWELLING OR PAIN  UNUSUAL VAGINAL DISCHARGE OR ITCHING   Items with * indicate a potential emergency and should be followed up as soon as possible or go to the Emergency Department if any problems should occur.  Please show the CHEMOTHERAPY ALERT CARD or IMMUNOTHERAPY ALERT CARD at check-in to the  Emergency Department and triage nurse.  Should you have questions after your visit or need to cancel or reschedule your appointment, please contact [facility]  Dept: [phone]  and follow the prompts.  Office hours are 8:00 a.m. to 4:30 p.m. Monday - Friday. Please note that voicemails left after 4:00 p.m. may not be returned until the following business day.  We are closed weekends and major holidays. You have access to a nurse at all times for urgent questions. Please call the main number to the clinic Dept: [phone] and follow the prompts.   For any non-urgent questions, you may also contact your provider using MyChart. We now offer e-Visits for anyone 38 and older to request care online for non-urgent symptoms. For details visit [email or web address].   Also download the MyChart app! Go to the app store, search "MyChart", open the app, select [facility], and log in with your MyChart username and password.  Due to Covid, a mask is required upon entering the hospital/clinic. If you do not have a mask, one will be given to you upon arrival. For doctor visits, patients may have 1 support person aged 14 or older with them. For treatment visits, patients cannot have anyone with them due to current Covid guidelines and our immunocompromised population.

## 2021-08-06 NOTE — Progress Notes (Signed)
  [facility] OFFICE PROGRESS NOTE   Diagnosis: CLL  INTERVAL HISTORY:   Mr. Jesse [name] returns as scheduled.  He feels well.  Good appetite and energy level.  No fever or night sweats.  No palpable lymph nodes.  He continues to have bilateral shoulder pain.  He received steroid injections to both shoulders in August. He received Evusheld in March.  He tolerated this well.  He reports nausea and feeling "terrible "following a Moderna booster vaccine last spring.  Objective:  Vital signs in last 24 hours:  Blood pressure 137/84, pulse 79, temperature 97.8 F (36.6 C), temperature source Oral, resp. rate 18, height $RemoveBe'6\' 1"'QRLPDlZjc$  (1.854 m), weight 226 lb 9.6 oz (102.8 kg), SpO2 100 %.    Lymphatics: 0.5 cm right supraclavicular node?,  No cervical nodes.  Bilateral 1-2 cm soft mobile axillary nodes, no inguinal or femoral nodes Resp: Lungs clear bilaterally Cardio: Regular rate and rhythm GI: No hepatosplenomegaly Vascular: No leg edema  Lab Results:  Lab Results  Component Value Date   WBC 5.9 08/06/2021   HGB 15.2 08/06/2021   HCT 44.5 08/06/2021   MCV 86.1 08/06/2021   PLT 165 08/06/2021   NEUTROABS 4.1 08/06/2021    CMP  Lab Results  Component Value Date   NA 138 01/24/2021   K 4.3 01/24/2021   CL 106 01/24/2021   CO2 23 01/24/2021   GLUCOSE 105 (H) 01/24/2021   BUN 26 (H) 01/24/2021   CREATININE 1.06 01/24/2021   CALCIUM 9.2 01/24/2021   PROT 6.3 (L) 01/24/2021   ALBUMIN 4.1 01/24/2021   AST 11 (L) 01/24/2021   ALT 11 01/24/2021   ALKPHOS 49 01/24/2021   BILITOT 0.6 01/24/2021   GFRNONAA >60 01/24/2021   GFRAA 93 10/11/2008     Medications: I have reviewed the patient's current medications.   Assessment/Plan: Chronic lymphocytic leukemia Excisional biopsy of right cervical lymph nodes 03/05/2020, kappa restricted B- lymphocytes, CD20, CD5, CD23, and BCL-2 positive.  FISH panel-trisomy 12, negative for loss of p53 CT neck 02/28/2020-diffuse bilateral  cervical and axillary lymphadenopathy Basal cell carcinoma left shoulder region April 2021 Hyperlipidemia COVID-19 infection July 2020 Hypogammaglobulinemia Serum monoclonal IgG kappa protein    Disposition: Mr. Watchman appears stable.  He is asymptomatic from CLL.  There is no indication for treating the CLL at present.  Lymphadenopathy has improved over the past year, potentially related to steroid injections.  He will seek medical attention for symptoms of an infection.  He received another dose of Evusheld today.  He will obtain an influenza vaccine.  He does not wish to receive another COVID-19 booster.  He will return for an office and lab visit in 6 months.  Betsy Coder, MD  08/06/2021  10:09 AM

## 2021-08-12 DIAGNOSIS — L821 Other seborrheic keratosis: Secondary | ICD-10-CM | POA: Diagnosis not present

## 2021-08-12 DIAGNOSIS — Z85828 Personal history of other malignant neoplasm of skin: Secondary | ICD-10-CM | POA: Diagnosis not present

## 2021-08-12 DIAGNOSIS — D2261 Melanocytic nevi of right upper limb, including shoulder: Secondary | ICD-10-CM | POA: Diagnosis not present

## 2021-08-12 DIAGNOSIS — D1801 Hemangioma of skin and subcutaneous tissue: Secondary | ICD-10-CM | POA: Diagnosis not present

## 2021-08-12 DIAGNOSIS — L570 Actinic keratosis: Secondary | ICD-10-CM | POA: Diagnosis not present

## 2021-11-01 DIAGNOSIS — Z01818 Encounter for other preprocedural examination: Secondary | ICD-10-CM | POA: Diagnosis not present

## 2021-11-01 DIAGNOSIS — H524 Presbyopia: Secondary | ICD-10-CM | POA: Diagnosis not present

## 2021-11-01 DIAGNOSIS — H2513 Age-related nuclear cataract, bilateral: Secondary | ICD-10-CM | POA: Diagnosis not present

## 2021-11-01 DIAGNOSIS — H1789 Other corneal scars and opacities: Secondary | ICD-10-CM | POA: Diagnosis not present

## 2022-01-02 DIAGNOSIS — M7541 Impingement syndrome of right shoulder: Secondary | ICD-10-CM | POA: Diagnosis not present

## 2022-01-02 DIAGNOSIS — M7542 Impingement syndrome of left shoulder: Secondary | ICD-10-CM | POA: Diagnosis not present

## 2022-01-02 DIAGNOSIS — M13811 Other specified arthritis, right shoulder: Secondary | ICD-10-CM | POA: Diagnosis not present

## 2022-01-02 DIAGNOSIS — M13812 Other specified arthritis, left shoulder: Secondary | ICD-10-CM | POA: Diagnosis not present

## 2022-02-03 ENCOUNTER — Inpatient Hospital Stay: Payer: BC Managed Care – PPO | Admitting: Oncology

## 2022-02-03 ENCOUNTER — Inpatient Hospital Stay: Payer: BC Managed Care – PPO

## 2022-02-06 ENCOUNTER — Inpatient Hospital Stay: Payer: BC Managed Care – PPO | Admitting: Oncology

## 2022-02-06 ENCOUNTER — Other Ambulatory Visit: Payer: Self-pay

## 2022-02-06 ENCOUNTER — Inpatient Hospital Stay: Payer: BC Managed Care – PPO | Attending: Oncology

## 2022-02-06 ENCOUNTER — Inpatient Hospital Stay: Payer: BC Managed Care – PPO

## 2022-02-06 VITALS — BP 168/69 | HR 71 | Temp 97.8°F | Resp 18 | Ht 73.0 in | Wt 220.6 lb

## 2022-02-06 DIAGNOSIS — C9110 Chronic lymphocytic leukemia of B-cell type not having achieved remission: Secondary | ICD-10-CM | POA: Diagnosis not present

## 2022-02-06 DIAGNOSIS — Z85828 Personal history of other malignant neoplasm of skin: Secondary | ICD-10-CM | POA: Insufficient documentation

## 2022-02-06 DIAGNOSIS — E785 Hyperlipidemia, unspecified: Secondary | ICD-10-CM | POA: Diagnosis not present

## 2022-02-06 DIAGNOSIS — D801 Nonfamilial hypogammaglobulinemia: Secondary | ICD-10-CM | POA: Insufficient documentation

## 2022-02-06 LAB — CBC WITH DIFFERENTIAL (CANCER CENTER ONLY)
Abs Immature Granulocytes: 0.01 10*3/uL (ref 0.00–0.07)
Basophils Absolute: 0 10*3/uL (ref 0.0–0.1)
Basophils Relative: 1 %
Eosinophils Absolute: 0.1 10*3/uL (ref 0.0–0.5)
Eosinophils Relative: 2 %
HCT: 46.2 % (ref 39.0–52.0)
Hemoglobin: 15.4 g/dL (ref 13.0–17.0)
Immature Granulocytes: 0 %
Lymphocytes Relative: 25 %
Lymphs Abs: 1.4 10*3/uL (ref 0.7–4.0)
MCH: 29.4 pg (ref 26.0–34.0)
MCHC: 33.3 g/dL (ref 30.0–36.0)
MCV: 88.2 fL (ref 80.0–100.0)
Monocytes Absolute: 0.4 10*3/uL (ref 0.1–1.0)
Monocytes Relative: 8 %
Neutro Abs: 3.6 10*3/uL (ref 1.7–7.7)
Neutrophils Relative %: 64 %
Platelet Count: 176 10*3/uL (ref 150–400)
RBC: 5.24 MIL/uL (ref 4.22–5.81)
RDW: 14.1 % (ref 11.5–15.5)
WBC Count: 5.6 10*3/uL (ref 4.0–10.5)
nRBC: 0 % (ref 0.0–0.2)

## 2022-02-06 NOTE — Progress Notes (Signed)
?  [facility] ?OFFICE PROGRESS NOTE ? ? ?Diagnosis: CLL ? ?INTERVAL HISTORY:  ? ?Jesse [name] returns as scheduled.  He feels well.  Good appetite.  No fever or night sweats.  No palpable lymph nodes.  He last had a shoulder injection 1 month ago.  He has decided to delay shoulder replacement surgery.  He had an upper respiratory infection a few months ago. ? ?Objective: ? ?Vital signs in last 24 hours: ? ?Blood pressure (!) 168/69, pulse 71, temperature 97.8 ?F (36.6 ?C), temperature source Oral, resp. rate 18, height $RemoveBe'6\' 1"'LOErDiUZQ$  (1.854 m), weight 220 lb 9.6 oz (100.1 kg), SpO2 97 %. ?  ? ? ?Lymphatics: No cervical, supraclavicular, inguinal, or femoral nodes.  1 cm mobile bilateral axillary nodes ?Resp: Lungs clear bilaterally ?Cardio: Regular rate and rhythm ?GI: No hepatosplenomegaly ?Vascular: No leg edema ? ? ?Lab Results: ? ?Lab Results  ?Component Value Date  ? WBC 5.6 02/06/2022  ? HGB 15.4 02/06/2022  ? HCT 46.2 02/06/2022  ? MCV 88.2 02/06/2022  ? PLT 176 02/06/2022  ? NEUTROABS 3.6 02/06/2022  ? ? ?CMP  ?Lab Results  ?Component Value Date  ? NA 138 01/24/2021  ? K 4.3 01/24/2021  ? CL 106 01/24/2021  ? CO2 23 01/24/2021  ? GLUCOSE 105 (H) 01/24/2021  ? BUN 26 (H) 01/24/2021  ? CREATININE 1.06 01/24/2021  ? CALCIUM 9.2 01/24/2021  ? PROT 6.3 (L) 01/24/2021  ? ALBUMIN 4.1 01/24/2021  ? AST 11 (L) 01/24/2021  ? ALT 11 01/24/2021  ? ALKPHOS 49 01/24/2021  ? BILITOT 0.6 01/24/2021  ? GFRNONAA >60 01/24/2021  ? GFRAA 93 10/11/2008  ? ? ? ?Medications: I have reviewed the patient's current medications. ? ? ?Assessment/Plan: ?Chronic lymphocytic leukemia ?Excisional biopsy of right cervical lymph nodes 03/05/2020, kappa restricted B- lymphocytes, CD20, CD5, CD23, and BCL-2 positive.  FISH panel-trisomy 12, negative for loss of p53 ?CT neck 02/28/2020-diffuse bilateral cervical and axillary lymphadenopathy ?Basal cell carcinoma left shoulder region April 2021 ?Hyperlipidemia ?COVID-19 infection July  2020 ?Hypogammaglobulinemia ?Serum monoclonal IgG kappa protein ? ? ? ? ?Disposition: ?Jesse. [name] appears well.  He is stable from a hematologic standpoint.  There is no indication for treating the CLL at present.  He will return for an office and lab visit in 6 months.  He will call in the interim for new symptoms. ?I let him know that Evusheld is no longer available. ? ?Betsy Coder, MD ? ?02/06/2022  ?10:29 AM ? ? ?

## 2022-03-10 DIAGNOSIS — D2262 Melanocytic nevi of left upper limb, including shoulder: Secondary | ICD-10-CM | POA: Diagnosis not present

## 2022-03-10 DIAGNOSIS — L570 Actinic keratosis: Secondary | ICD-10-CM | POA: Diagnosis not present

## 2022-03-10 DIAGNOSIS — Z85828 Personal history of other malignant neoplasm of skin: Secondary | ICD-10-CM | POA: Diagnosis not present

## 2022-03-10 DIAGNOSIS — L814 Other melanin hyperpigmentation: Secondary | ICD-10-CM | POA: Diagnosis not present

## 2022-03-10 DIAGNOSIS — D2261 Melanocytic nevi of right upper limb, including shoulder: Secondary | ICD-10-CM | POA: Diagnosis not present

## 2022-03-10 DIAGNOSIS — C44629 Squamous cell carcinoma of skin of left upper limb, including shoulder: Secondary | ICD-10-CM | POA: Diagnosis not present

## 2022-03-27 ENCOUNTER — Other Ambulatory Visit: Payer: Self-pay | Admitting: Family Medicine

## 2022-03-27 NOTE — Telephone Encounter (Signed)
Pt needs a physical app for further refills ?

## 2022-06-25 ENCOUNTER — Other Ambulatory Visit: Payer: Self-pay | Admitting: Family Medicine

## 2022-08-14 ENCOUNTER — Inpatient Hospital Stay: Payer: BC Managed Care – PPO | Admitting: Oncology

## 2022-08-14 ENCOUNTER — Inpatient Hospital Stay: Payer: BC Managed Care – PPO | Attending: Oncology

## 2022-08-14 VITALS — BP 131/82 | HR 73 | Temp 98.1°F | Resp 20 | Ht 73.0 in | Wt 221.4 lb

## 2022-08-14 DIAGNOSIS — C9110 Chronic lymphocytic leukemia of B-cell type not having achieved remission: Secondary | ICD-10-CM

## 2022-08-14 DIAGNOSIS — Z85828 Personal history of other malignant neoplasm of skin: Secondary | ICD-10-CM | POA: Insufficient documentation

## 2022-08-14 DIAGNOSIS — D801 Nonfamilial hypogammaglobulinemia: Secondary | ICD-10-CM | POA: Diagnosis not present

## 2022-08-14 DIAGNOSIS — E785 Hyperlipidemia, unspecified: Secondary | ICD-10-CM | POA: Diagnosis not present

## 2022-08-14 LAB — CBC WITH DIFFERENTIAL (CANCER CENTER ONLY)
Abs Immature Granulocytes: 0.01 10*3/uL (ref 0.00–0.07)
Basophils Absolute: 0 10*3/uL (ref 0.0–0.1)
Basophils Relative: 1 %
Eosinophils Absolute: 0.1 10*3/uL (ref 0.0–0.5)
Eosinophils Relative: 2 %
HCT: 42.8 % (ref 39.0–52.0)
Hemoglobin: 14.6 g/dL (ref 13.0–17.0)
Immature Granulocytes: 0 %
Lymphocytes Relative: 18 %
Lymphs Abs: 1.1 10*3/uL (ref 0.7–4.0)
MCH: 29.9 pg (ref 26.0–34.0)
MCHC: 34.1 g/dL (ref 30.0–36.0)
MCV: 87.5 fL (ref 80.0–100.0)
Monocytes Absolute: 0.4 10*3/uL (ref 0.1–1.0)
Monocytes Relative: 7 %
Neutro Abs: 4.3 10*3/uL (ref 1.7–7.7)
Neutrophils Relative %: 72 %
Platelet Count: 151 10*3/uL (ref 150–400)
RBC: 4.89 MIL/uL (ref 4.22–5.81)
RDW: 13.8 % (ref 11.5–15.5)
WBC Count: 6 10*3/uL (ref 4.0–10.5)
nRBC: 0 % (ref 0.0–0.2)

## 2022-08-14 LAB — CMP (CANCER CENTER ONLY)
ALT: 12 U/L (ref 0–44)
AST: 15 U/L (ref 15–41)
Albumin: 4.5 g/dL (ref 3.5–5.0)
Alkaline Phosphatase: 46 U/L (ref 38–126)
Anion gap: 6 (ref 5–15)
BUN: 18 mg/dL (ref 6–20)
CO2: 28 mmol/L (ref 22–32)
Calcium: 9 mg/dL (ref 8.9–10.3)
Chloride: 106 mmol/L (ref 98–111)
Creatinine: 1.19 mg/dL (ref 0.61–1.24)
GFR, Estimated: >60 mL/min (ref >60)
Glucose, Bld: 114 mg/dL — ABNORMAL HIGH (ref 70–99)
Potassium: 4.3 mmol/L (ref 3.5–5.1)
Sodium: 140 mmol/L (ref 135–145)
Total Bilirubin: 1.1 mg/dL (ref 0.3–1.2)
Total Protein: 6 g/dL — ABNORMAL LOW (ref 6.5–8.1)

## 2022-08-14 LAB — LACTATE DEHYDROGENASE: LDH: 147 U/L (ref 98–192)

## 2022-08-14 NOTE — Progress Notes (Signed)
  [facility] OFFICE PROGRESS NOTE   Diagnosis: CLL  INTERVAL HISTORY:   Jesse [name] returns as scheduled.  He feels well.  Good appetite.  He is exercising.  He received a steroid injection for shoulder pain approximately 3 weeks ago.  Objective:  Vital signs in last 24 hours:  Blood pressure 131/82, pulse 73, temperature 98.1 F (36.7 C), temperature source Oral, resp. rate 20, height $RemoveBe'6\' 1"'RFhdXyowG$  (1.854 m), weight 221 lb 6.4 oz (100.4 kg), SpO2 98 %.   Lymphatics: No cervical, supraclavicular, or inguinal nodes.  1 cm mobile bilateral axillary nodes Resp: Lungs clear bilaterally Cardio: Regular rate and rhythm GI: No hepatosplenomegaly Vascular: No leg edema   Lab Results:  Lab Results  Component Value Date   WBC 6.0 08/14/2022   HGB 14.6 08/14/2022   HCT 42.8 08/14/2022   MCV 87.5 08/14/2022   PLT 151 08/14/2022   NEUTROABS 4.3 08/14/2022    CMP  Lab Results  Component Value Date   NA 140 08/14/2022   K 4.3 08/14/2022   CL 106 08/14/2022   CO2 28 08/14/2022   GLUCOSE 114 (H) 08/14/2022   BUN 18 08/14/2022   CREATININE 1.19 08/14/2022   CALCIUM 9.0 08/14/2022   PROT 6.0 (L) 08/14/2022   ALBUMIN 4.5 08/14/2022   AST 15 08/14/2022   ALT 12 08/14/2022   ALKPHOS 46 08/14/2022   BILITOT 1.1 08/14/2022   GFRNONAA >60 08/14/2022   GFRAA 93 10/11/2008   Medications: I have reviewed the patient's current medications.   Assessment/Plan: Chronic lymphocytic leukemia Excisional biopsy of right cervical lymph nodes 03/05/2020, kappa restricted B- lymphocytes, CD20, CD5, CD23, and BCL-2 positive.  FISH panel-trisomy 12, negative for loss of p53 CT neck 02/28/2020-diffuse bilateral cervical and axillary lymphadenopathy Basal cell carcinoma left shoulder region April 2021 Hyperlipidemia COVID-19 infection July 2020 Hypogammaglobulinemia Serum monoclonal IgG kappa protein      Disposition: Jesse [name] has CLL.  He is asymptomatic and stable from a  hematologic standpoint.  The plan is to continue observation.  He will obtain an influenza vaccine.  He does not plan on additional COVID-19 vaccine secondary to reactions to the COVID vaccines in the past. The palpable lymph nodes appear smaller compared to a few years ago.  This is likely secondary to repeat steroid injection therapy.   He will return for office and lab visit in 6 months.  He will call for new symptoms.  Betsy Coder, MD  08/14/2022  11:14 AM

## 2022-08-19 ENCOUNTER — Encounter: Payer: BC Managed Care – PPO | Admitting: Family Medicine

## 2022-08-26 ENCOUNTER — Ambulatory Visit (INDEPENDENT_AMBULATORY_CARE_PROVIDER_SITE_OTHER): Payer: BC Managed Care – PPO | Admitting: Family Medicine

## 2022-08-26 ENCOUNTER — Encounter: Payer: Self-pay | Admitting: Family Medicine

## 2022-08-26 VITALS — BP 110/70 | HR 70 | Temp 97.6°F | Wt 222.0 lb

## 2022-08-26 DIAGNOSIS — Z0000 Encounter for general adult medical examination without abnormal findings: Secondary | ICD-10-CM

## 2022-08-26 LAB — HEMOGLOBIN A1C: Hgb A1c MFr Bld: 5.5 % (ref 4.6–6.5)

## 2022-08-26 LAB — TSH: TSH: 1.87 u[IU]/mL (ref 0.35–5.50)

## 2022-08-26 LAB — PSA: PSA: 0.46 ng/mL (ref 0.10–4.00)

## 2022-08-26 MED ORDER — SIMVASTATIN 40 MG PO TABS: 40.0000 mg | ORAL_TABLET | Freq: Every day | ORAL | 3 refills | Status: DC

## 2022-08-26 NOTE — Progress Notes (Signed)
Subjective:    Patient ID: Jesse [name], male    DOB: 07-19-1961, 62 y.o.   MRN: [id number]  HPI Here for a well exam. He feels well in general. For the past 10 days he has had some sinus congestion, PND, and a dry cough but he does not feel sick per se. No fever. He saw Dr. Benay Spice on 08-14-22, and all his labs looked great. The CLL is in remission.    Review of Systems  Constitutional: Negative.   HENT:  Positive for congestion and postnasal drip. Negative for ear pain and sore throat.   Eyes: Negative.   Respiratory:  Positive for cough. Negative for chest tightness, shortness of breath and wheezing.   Cardiovascular: Negative.   Gastrointestinal: Negative.   Genitourinary: Negative.   Musculoskeletal: Negative.   Skin: Negative.   Neurological: Negative.   Psychiatric/Behavioral: Negative.         Objective:   Physical Exam Constitutional:      General: He is not in acute distress.    Appearance: Normal appearance. He is well-developed. He is not diaphoretic.  HENT:     Head: Normocephalic and atraumatic.     Right Ear: External ear normal.     Left Ear: External ear normal.     Nose: Nose normal.     Mouth/Throat:     Pharynx: No oropharyngeal exudate.  Eyes:     General: No scleral icterus.       Right eye: No discharge.        Left eye: No discharge.     Conjunctiva/sclera: Conjunctivae normal.     Pupils: Pupils are equal, round, and reactive to light.  Neck:     Thyroid: No thyromegaly.     Vascular: No JVD.     Trachea: No tracheal deviation.  Cardiovascular:     Rate and Rhythm: Normal rate and regular rhythm.     Heart sounds: Normal heart sounds. No murmur heard.    No friction rub. No gallop.  Pulmonary:     Effort: Pulmonary effort is normal. No respiratory distress.     Breath sounds: Normal breath sounds. No wheezing or rales.  Chest:     Chest wall: No tenderness.  Abdominal:     General: Bowel sounds are normal. There is no distension.      Palpations: Abdomen is soft. There is no mass.     Tenderness: There is no abdominal tenderness. There is no guarding or rebound.  Genitourinary:    Penis: Normal. No tenderness.      Testes: Normal.     Prostate: Normal.     Rectum: Normal. Guaiac result negative.  Musculoskeletal:        General: No tenderness. Normal range of motion.     Cervical back: Neck supple.  Lymphadenopathy:     Cervical: No cervical adenopathy.  Skin:    General: Skin is warm and dry.     Coloration: Skin is not pale.     Findings: No erythema or rash.  Neurological:     Mental Status: He is alert and oriented to person, place, and time.     Cranial Nerves: No cranial nerve deficit.     Motor: No abnormal muscle tone.     Coordination: Coordination normal.     Deep Tendon Reflexes: Reflexes are normal and symmetric. Reflexes normal.  Psychiatric:        Behavior: Behavior normal.        Thought Content:  Thought content normal.        Judgment: Judgment normal.           Assessment & Plan:  Well exam. We discussed diet and exercise. Get fasting labs. If sounds like he has had a viral illness of some sort. He will let us know if this lasts another week or if he feels worse.  Alysia Penna, MD

## 2022-08-27 LAB — LIPID PANEL
Cholesterol: 184 mg/dL (ref 0–200)
HDL: 61.3 mg/dL (ref >39.00)
LDL Cholesterol: 112 mg/dL — ABNORMAL HIGH (ref 0–99)
NonHDL: 122.61
Total CHOL/HDL Ratio: 3
Triglycerides: 54 mg/dL (ref 0.0–149.0)
VLDL: 10.8 mg/dL (ref 0.0–40.0)

## 2022-09-20 ENCOUNTER — Other Ambulatory Visit: Payer: Self-pay | Admitting: Family Medicine

## 2022-09-23 DIAGNOSIS — L570 Actinic keratosis: Secondary | ICD-10-CM | POA: Diagnosis not present

## 2022-09-23 DIAGNOSIS — L812 Freckles: Secondary | ICD-10-CM | POA: Diagnosis not present

## 2022-09-23 DIAGNOSIS — C44519 Basal cell carcinoma of skin of other part of trunk: Secondary | ICD-10-CM | POA: Diagnosis not present

## 2022-09-23 DIAGNOSIS — L821 Other seborrheic keratosis: Secondary | ICD-10-CM | POA: Diagnosis not present

## 2022-09-23 DIAGNOSIS — Z85828 Personal history of other malignant neoplasm of skin: Secondary | ICD-10-CM | POA: Diagnosis not present

## 2022-09-23 DIAGNOSIS — D2261 Melanocytic nevi of right upper limb, including shoulder: Secondary | ICD-10-CM | POA: Diagnosis not present

## 2022-10-23 ENCOUNTER — Ambulatory Visit
Admission: EM | Admit: 2022-10-23 | Discharge: 2022-10-23 | Disposition: A | Discharge status: Home / Self Care | Payer: BC Managed Care – PPO | Attending: Nurse Practitioner | Admitting: Nurse Practitioner

## 2022-10-23 ENCOUNTER — Ambulatory Visit (INDEPENDENT_AMBULATORY_CARE_PROVIDER_SITE_OTHER): Payer: BC Managed Care – PPO

## 2022-10-23 ENCOUNTER — Ambulatory Visit: Payer: Self-pay

## 2022-10-23 ENCOUNTER — Telehealth: Payer: Self-pay | Admitting: *Deleted

## 2022-10-23 DIAGNOSIS — Z1152 Encounter for screening for COVID-19: Secondary | ICD-10-CM | POA: Diagnosis not present

## 2022-10-23 DIAGNOSIS — J209 Acute bronchitis, unspecified: Secondary | ICD-10-CM | POA: Diagnosis not present

## 2022-10-23 DIAGNOSIS — R509 Fever, unspecified: Secondary | ICD-10-CM

## 2022-10-23 DIAGNOSIS — R051 Acute cough: Secondary | ICD-10-CM | POA: Diagnosis not present

## 2022-10-23 LAB — RESP PANEL BY RT-PCR (FLU A&B, COVID) ARPGX2
Influenza A by PCR: NEGATIVE
Influenza B by PCR: NEGATIVE
SARS Coronavirus 2 by RT PCR: NEGATIVE

## 2022-10-23 NOTE — Discharge Instructions (Signed)
Your symptoms and exam are consistent for a viral illness. Please treat your symptoms with over the counter cough medication, tylenol or ibuprofen, humidifier, and rest. Viral illnesses can last 7-14 days. Please follow up with your PCP if your symptoms are not improving. Please go to the ER for any worsening symptoms. This includes but is not limited to fever you can not control with tylenol or ibuprofen, you are not able to stay hydrated, you have shortness of breath or chest pain.  Thank you for choosing [facility] for your healthcare needs. I hope you feel better soon!  

## 2022-10-23 NOTE — Telephone Encounter (Addendum)
Mrs. Dilorenzo called to report Mr. Donley has had laryngitis, sinus and chest congestion w/NP cough for ~ 5 days. Also having backache and feeling lethargic. Low grade fever ~ 99.0. Asking how to proceed? They have not called PCP or did any COVID testing thus far. Informed her that most likely MD will recommend they reach out to PCP, but this RN will make MD aware of above symptoms. Confirmed w/MD that PCP is best to f/u on this issue. Mrs. Hass had already called and was told to go to urgent care.

## 2022-10-23 NOTE — ED Triage Notes (Signed)
Pt c/o cough and congestion with green sputum x6 days. States had a low grade temp last night. States having back pain from coughing hard. States taking OTC meds with little relief.

## 2022-10-23 NOTE — ED Provider Notes (Signed)
[facility]    CSN: [id number] Arrival date & time: 10/23/22  1412      History   Chief Complaint Chief Complaint  Patient presents with   Cough   Fever    HPI Jesse [name] is a 61 y.o. male who presents for evaluation of cough.  Patient reports 6 days of a productive cough with phlegm, sore throat, fatigue, and low-grade fevers.  Denies any history of asthma or smoking.  He does have a history of CLL but he is not sure if he is in remission.  Not currently undergoing treatment.  He is vaccinated for COVID.  He has not received the flu vaccine this season.  He has been taking OTC cough medicine with minimal relief.  No other concerns at this time.   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 Jarome Matin    COLONOSCOPY  03/02/2020   per Dr. Loletha Carrow, precancerous polyp, repeat in 5 yrs    EXCISION MASS NECK N/A 03/05/2020   Procedure: EXCISION MASS NECK;  Surgeon: Izora Gala, MD;  Location: [facility];  Service: ENT;  Laterality: N/A;   HERNIA REPAIR     right , ingunial   KNEE SURGERY     LASIK         Home Medications    Prior to Admission medications   Medication Sig Start Date End Date Taking? Authorizing Provider  simvastatin (ZOCOR) 40 MG tablet TAKE 1 TABLET(40 MG) BY MOUTH AT BEDTIME 09/22/22   Laurey Morale, MD    Family History Family History  Problem Relation Age of Onset   Colon polyps Mother    Colon cancer Maternal Grandfather    Alcohol abuse Neg Hx        family hx   Cancer Neg Hx        breast ca - family hx   Depression Neg Hx        family hx   Esophageal cancer Neg Hx    Rectal cancer Neg Hx    Stomach cancer Neg Hx     Social History Social History   Tobacco Use   Smoking status: Never   Smokeless tobacco: Never  Substance Use Topics   Alcohol use: Yes    Comment: occas   Drug use: Never     Allergies   Japanese encephalitis virus vacc inact (murine)   Review of Systems Review of Systems  Constitutional:  Positive for fatigue and fever.  HENT:  Positive for sore throat.   Respiratory:  Positive for cough.      Physical Exam Triage Vital Signs ED Triage Vitals [10/23/22 1429]  Enc Vitals Group     BP 131/75     Pulse Rate 90     Resp 18     Temp 97.8  F (36.6 C)     Temp Source Oral     SpO2 96 %     Weight      Height      Head Circumference      Peak Flow      Pain Score 1     Pain Loc      Pain Edu?      Excl. in [place]?    No data found.  Updated Vital Signs BP 131/75 (BP Location: Left Arm)   Pulse 90   Temp 97.8 F (36.6 C) (Oral)   Resp 18   SpO2 96%   Visual Acuity Right Eye Distance:   Left Eye Distance:   Bilateral Distance:    Right Eye Near:   Left Eye Near:    Bilateral Near:     Physical Exam Vitals and nursing note reviewed.  Constitutional:      General: He is not in acute distress.    Appearance: Normal appearance. He is not ill-appearing or toxic-appearing.  HENT:     Head: Normocephalic and atraumatic.     Right Ear: Tympanic membrane and ear canal normal.     Left Ear: Tympanic membrane and ear canal normal.     Nose: Congestion present.     Mouth/Throat:     Mouth: Mucous membranes are moist.     Pharynx: Posterior oropharyngeal erythema present.  Eyes:     Pupils: Pupils are equal, round, and  reactive to light.  Cardiovascular:     Rate and Rhythm: Normal rate and regular rhythm.     Heart sounds: Normal heart sounds.  Pulmonary:     Effort: Pulmonary effort is normal.     Breath sounds: Normal breath sounds.  Musculoskeletal:     Cervical back: Normal range of motion and neck supple.  Lymphadenopathy:     Cervical: No cervical adenopathy.  Skin:    General: Skin is warm and dry.  Neurological:     General: No focal deficit present.     Mental Status: He is alert and oriented to person, place, and time.  Psychiatric:        Mood and Affect: Mood normal.        Behavior: Behavior normal.      UC Treatments / Results  Labs (all labs ordered are listed, but only abnormal results are displayed) Labs Reviewed  RESP PANEL BY RT-PCR (FLU A&B, COVID) [id number]    EKG   Radiology DG Chest 2 View  Result Date: 10/23/2022 CLINICAL DATA:  Fever EXAM: CHEST - 2 VIEW COMPARISON:  None Available. FINDINGS: The heart size and mediastinal contours are within normal limits. Both lungs are clear. The visualized skeletal structures are unremarkable. IMPRESSION: No active cardiopulmonary disease. Electronically Signed   By: Yetta Glassman M.D.   On: 10/23/2022 15:02    Procedures Procedures (including critical care time)  Medications Ordered in UC Medications - No data to display  Initial Impression / Assessment and Plan / UC Course  I have reviewed the triage vital signs and the nursing notes.  Pertinent labs & imaging results that were available during my care of the patient were reviewed by me and considered in my medical decision making (see chart for details).     Negative chest x-ray Discussed with patient viral illness/viral bronchitis and need for symptomatic treatment He declined Tessalon and will use OTC cough medicine as needed Rest and fluids Follow-up with PCP 2 to 3 days for recheck  ER for any worsening symptoms and he verbalized understanding Final  Clinical Impressions(s) / UC Diagnoses   Final diagnoses:  Acute cough  Acute bronchitis, unspecified organism     Discharge Instructions      Your symptoms and exam are consistent for a viral illness. Please treat your symptoms with over the counter cough medication, tylenol or ibuprofen, humidifier, and rest. Viral illnesses can last 7-14 days. Please follow up with your PCP if your symptoms are not improving. Please go to the ER for any worsening symptoms. This includes but is not limited to fever you can not control with tylenol or ibuprofen, you are not able to stay hydrated, you have shortness of breath or chest pain.  Thank you for choosing [facility] for your healthcare needs. I hope you feel better soon!      ED Prescriptions   None    PDMP not reviewed this encounter.   Melynda Ripple, NP 10/23/22 [phone]

## 2022-11-07 ENCOUNTER — Ambulatory Visit
Admission: RE | Admit: 2022-11-07 | Discharge: 2022-11-07 | Disposition: A | Discharge status: Home / Self Care | Payer: BC Managed Care – PPO | Source: Ambulatory Visit | Attending: Internal Medicine | Admitting: Internal Medicine

## 2022-11-07 VITALS — BP 145/80 | HR 75 | Temp 98.3°F | Resp 16

## 2022-11-07 DIAGNOSIS — J020 Streptococcal pharyngitis: Secondary | ICD-10-CM

## 2022-11-07 LAB — POCT RAPID STREP A (OFFICE): Rapid Strep A Screen: POSITIVE — AB

## 2022-11-07 MED ORDER — PENICILLIN V POTASSIUM 500 MG PO TABS: 500.0000 mg | ORAL_TABLET | Freq: Two times a day (BID) | ORAL | 0 refills | Status: AC

## 2022-11-07 NOTE — ED Provider Notes (Signed)
[facility]    CSN: [id number] Arrival date & time: 11/07/22  1613      History   Chief Complaint Chief Complaint  Patient presents with   Sore Throat    Entered by patient    HPI Jesse [name] is a 61 y.o. male.   61 year old male presents today due to concerns of a sore throat.  He was seen 2 weeks ago, Diagnosed with bronchitis.  States over the past 3 days, he has had a severe sore throat .  Denies fever.  Normal appetite. States that overall his bronchitis has improved significantly. Pt is going out of town for the holidays and wanted to make sure he did not have strep throat.   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 Jarome Matin    COLONOSCOPY  03/02/2020   per Dr. Loletha Carrow, precancerous polyp, repeat in 5 yrs    EXCISION MASS NECK N/A 03/05/2020   Procedure: EXCISION MASS NECK;  Surgeon: Izora Gala, MD;  Location: [facility];  Service: ENT;  Laterality: N/A;   HERNIA REPAIR     right , ingunial   KNEE SURGERY     LASIK         Home Medications    Prior to Admission medications   Medication Sig Start Date End Date Taking? Authorizing Provider  penicillin v potassium (VEETID) 500 MG tablet Take 1 tablet (500 mg total) by mouth in the morning and at bedtime for 10 days. 11/07/22 11/17/22 Yes Meshilem Machuca L, PA  simvastatin (ZOCOR) 40 MG tablet TAKE 1 TABLET(40 MG) BY MOUTH AT BEDTIME 09/22/22    Laurey Morale, MD    Family History Family History  Problem Relation Age of Onset   Colon polyps Mother    Colon cancer Maternal Grandfather    Alcohol abuse Neg Hx        family hx   Cancer Neg Hx        breast ca - family hx   Depression Neg Hx        family hx   Esophageal cancer Neg Hx    Rectal cancer Neg Hx    Stomach cancer Neg Hx     Social History Social History   Tobacco Use   Smoking status: Never   Smokeless tobacco: Never  Vaping Use   Vaping Use: Never used  Substance Use Topics   Alcohol use: Yes    Comment: occas   Drug use: Never     Allergies   Japanese encephalitis virus vacc inact (murine)   Review of Systems Review of Systems As per HPI  Physical Exam Triage Vital Signs ED Triage Vitals  Enc Vitals Group     BP 11/07/22 1631 (!) 145/80     Pulse Rate 11/07/22 1631 75     Resp 11/07/22 1631 16     Temp 11/07/22 1631 98.3 F (36.8  C)     Temp Source 11/07/22 1631 Oral     SpO2 11/07/22 1631 95 %     Weight --      Height --      Head Circumference --      Peak Flow --      Pain Score 11/07/22 1630 4     Pain Loc --      Pain Edu? --      Excl. in [place]? --    No data found.  Updated Vital Signs BP (!) 145/80 (BP Location: Left Arm)   Pulse 75   Temp 98.3 F (36.8 C) (Oral)   Resp 16   SpO2 95%   Visual Acuity Right Eye Distance:   Left Eye Distance:   Bilateral Distance:    Right Eye Near:   Left Eye Near:    Bilateral Near:     Physical Exam Vitals and nursing note reviewed.  Constitutional:      General: He is not in acute distress.    Appearance: He is well-developed and normal weight. He is not ill-appearing or toxic-appearing.  HENT:     Head: Normocephalic and atraumatic.     Right Ear: Tympanic membrane and ear canal normal. No drainage, swelling or tenderness. No middle ear effusion. Tympanic membrane is not erythematous.     Left Ear: Tympanic membrane and ear canal normal. No drainage, swelling or  tenderness.  No middle ear effusion. Tympanic membrane is not erythematous.     Nose: No congestion or rhinorrhea.     Mouth/Throat:     Mouth: Mucous membranes are moist. No oral lesions.     Pharynx: Uvula midline. Oropharyngeal exudate and posterior oropharyngeal erythema present. No pharyngeal swelling or uvula swelling.     Tonsils: Tonsillar exudate present. No tonsillar abscesses.  Eyes:     Conjunctiva/sclera: Conjunctivae normal.     Pupils: Pupils are equal, round, and reactive to light.  Neck:     Thyroid: No thyromegaly.  Cardiovascular:     Rate and Rhythm: Normal rate and regular rhythm.     Heart sounds: No murmur heard.    No gallop.  Pulmonary:     Effort: Pulmonary effort is normal. No respiratory distress.     Breath sounds: Normal breath sounds. No stridor. No wheezing, rhonchi or rales.  Chest:     Chest wall: No tenderness.  Abdominal:     General: Bowel sounds are normal. There is no distension.     Palpations: Abdomen is soft. There is no mass.     Tenderness: There is no abdominal tenderness. There is no rebound.  Musculoskeletal:     Cervical back: Normal range of motion and neck supple.  Lymphadenopathy:     Cervical: No cervical adenopathy.  Skin:    General: Skin is warm.     Findings: No erythema or rash.  Neurological:     General: No focal deficit present.     Mental Status: He is alert and oriented to person, place, and time.  Psychiatric:        Mood and Affect: Mood normal.        Behavior: Behavior normal.      UC Treatments / Results  Labs (all labs ordered are listed, but only abnormal results are displayed) Labs Reviewed  POCT RAPID STREP A (OFFICE) - Abnormal; Notable for the following components:      Result Value   Rapid Strep A Screen Positive (*)  All other components within normal limits    EKG   Radiology No results found.  Procedures Procedures (including critical care time)  Medications Ordered in  UC Medications - No data to display  Initial Impression / Assessment and Plan / UC Course  I have reviewed the triage vital signs and the nursing notes.  Pertinent labs & imaging results that were available during my care of the patient were reviewed by me and considered in my medical decision making (see chart for details).     Strep sore throat - PCN VK BID x 10 days. Additional supportive measures reviewed.    Final Clinical Impressions(s) / UC Diagnoses   Final diagnoses:  Streptococcal sore throat     Discharge Instructions      Your rapid strep was positive for strep throat. Please start taking antibiotics as prescribed. Do not stop taking them until all has been completed. After completing the third day of antibiotics, throw away your current toothbrush and get a new one. This will prevent re-contamination. You may alternate ibuprofen and tylenol every 4 hours for fever and pain control. Do not share food, beverages, or kiss on the lips until >48 hours after starting antibiotics and >24 hours after fever resolved. This is contagious.       ED Prescriptions     Medication Sig Dispense Auth. Provider   penicillin v potassium (VEETID) 500 MG tablet Take 1 tablet (500 mg total) by mouth in the morning and at bedtime for 10 days. 20 tablet Kevin Space L, [place]      PDMP not reviewed this encounter.   Chaney Malling, [place] 11/07/22 1707

## 2022-11-07 NOTE — Discharge Instructions (Signed)
Your rapid strep was positive for strep throat. Please start taking antibiotics as prescribed. Do not stop taking them until all has been completed. After completing the third day of antibiotics, throw away your current toothbrush and get a new one. This will prevent re-contamination. You may alternate ibuprofen and tylenol every 4 hours for fever and pain control. Do not share food, beverages, or kiss on the lips until >48 hours after starting antibiotics and >24 hours after fever resolved. This is contagious.   

## 2022-11-07 NOTE — ED Triage Notes (Signed)
Pt c/o sore throat x 3 days-nasal congestion x 2 week-states he was seen her for congestion-NAD-steady gait

## 2023-02-12 ENCOUNTER — Other Ambulatory Visit: Payer: BC Managed Care – PPO

## 2023-02-12 ENCOUNTER — Ambulatory Visit: Payer: BC Managed Care – PPO | Admitting: Oncology

## 2023-02-17 ENCOUNTER — Encounter: Payer: Self-pay | Admitting: Oncology

## 2023-02-17 ENCOUNTER — Inpatient Hospital Stay: Payer: BC Managed Care – PPO | Attending: Oncology

## 2023-02-17 ENCOUNTER — Inpatient Hospital Stay: Payer: BC Managed Care – PPO | Admitting: Oncology

## 2023-02-17 VITALS — BP 128/80 | HR 96 | Temp 98.1°F | Resp 18 | Ht 73.0 in | Wt 222.0 lb

## 2023-02-17 DIAGNOSIS — Z85828 Personal history of other malignant neoplasm of skin: Secondary | ICD-10-CM | POA: Diagnosis not present

## 2023-02-17 DIAGNOSIS — E785 Hyperlipidemia, unspecified: Secondary | ICD-10-CM | POA: Diagnosis not present

## 2023-02-17 DIAGNOSIS — C9110 Chronic lymphocytic leukemia of B-cell type not having achieved remission: Secondary | ICD-10-CM | POA: Insufficient documentation

## 2023-02-17 DIAGNOSIS — D801 Nonfamilial hypogammaglobulinemia: Secondary | ICD-10-CM | POA: Insufficient documentation

## 2023-02-17 LAB — CBC WITH DIFFERENTIAL (CANCER CENTER ONLY)
Abs Immature Granulocytes: 0.02 10*3/uL (ref 0.00–0.07)
Basophils Absolute: 0 10*3/uL (ref 0.0–0.1)
Basophils Relative: 1 %
Eosinophils Absolute: 0.1 10*3/uL (ref 0.0–0.5)
Eosinophils Relative: 2 %
HCT: 48.3 % (ref 39.0–52.0)
Hemoglobin: 16.5 g/dL (ref 13.0–17.0)
Immature Granulocytes: 0 %
Lymphocytes Relative: 15 %
Lymphs Abs: 1 10*3/uL (ref 0.7–4.0)
MCH: 29.6 pg (ref 26.0–34.0)
MCHC: 34.2 g/dL (ref 30.0–36.0)
MCV: 86.7 fL (ref 80.0–100.0)
Monocytes Absolute: 0.4 10*3/uL (ref 0.1–1.0)
Monocytes Relative: 6 %
Neutro Abs: 5 10*3/uL (ref 1.7–7.7)
Neutrophils Relative %: 76 %
Platelet Count: 197 10*3/uL (ref 150–400)
RBC: 5.57 MIL/uL (ref 4.22–5.81)
RDW: 13.6 % (ref 11.5–15.5)
WBC Count: 6.5 10*3/uL (ref 4.0–10.5)
nRBC: 0 % (ref 0.0–0.2)

## 2023-02-17 LAB — CMP (CANCER CENTER ONLY)
ALT: 13 U/L (ref 0–44)
AST: 14 U/L — ABNORMAL LOW (ref 15–41)
Albumin: 4.8 g/dL (ref 3.5–5.0)
Alkaline Phosphatase: 60 U/L (ref 38–126)
Anion gap: 6 (ref 5–15)
BUN: 15 mg/dL (ref 6–20)
CO2: 28 mmol/L (ref 22–32)
Calcium: 9.8 mg/dL (ref 8.9–10.3)
Chloride: 105 mmol/L (ref 98–111)
Creatinine: 1.24 mg/dL (ref 0.61–1.24)
GFR, Estimated: >60 mL/min (ref >60)
Glucose, Bld: 113 mg/dL — ABNORMAL HIGH (ref 70–99)
Potassium: 4.5 mmol/L (ref 3.5–5.1)
Sodium: 139 mmol/L (ref 135–145)
Total Bilirubin: 1.1 mg/dL (ref 0.3–1.2)
Total Protein: 6.7 g/dL (ref 6.5–8.1)

## 2023-02-17 LAB — LACTATE DEHYDROGENASE: LDH: 152 U/L (ref 98–192)

## 2023-02-17 NOTE — Progress Notes (Signed)
  [facility] OFFICE PROGRESS NOTE   Diagnosis: CLL  INTERVAL HISTORY:   Mr. Gurkin returns as scheduled.  He feels well.  No fever or night sweats.  Good appetite.  He is exercising.  He had an upper respiratory infection in the fall.  He reports having "bronchitis ".  He was diagnosed with strep throat in December.  He was treated with penicillin.  He is followed by Dr. Amedeo Plenty for shoulder pain.  He receives mitten steroid injection therapy. Objective:  Vital signs in last 24 hours:  Blood pressure 128/80, pulse 96, temperature 98.1 F (36.7 C), temperature source Oral, resp. rate 18, height 6\' 1"  (1.854 m), weight 222 lb (100.7 kg), SpO2 98 %.    Lymphatics: No cervical or supraclavicular nodes.  1 cm soft mobile bilateral axillary nodes.  1-2 cm lateral left inguinal node?  No other axillary or inguinal nodes Resp: Lungs clear bilaterally Cardio: Regular rate and rhythm GI: No hepatosplenomegaly Vascular: No leg edema   Lab Results:  Lab Results  Component Value Date   WBC 6.5 02/17/2023   HGB 16.5 02/17/2023   HCT 48.3 02/17/2023   MCV 86.7 02/17/2023   PLT 197 02/17/2023   NEUTROABS 5.0 02/17/2023    CMP  Lab Results  Component Value Date   NA 140 08/14/2022   K 4.3 08/14/2022   CL 106 08/14/2022   CO2 28 08/14/2022   GLUCOSE 114 (H) 08/14/2022   BUN 18 08/14/2022   CREATININE 1.19 08/14/2022   CALCIUM 9.0 08/14/2022   PROT 6.0 (L) 08/14/2022   ALBUMIN 4.5 08/14/2022   AST 15 08/14/2022   ALT 12 08/14/2022   ALKPHOS 46 08/14/2022   BILITOT 1.1 08/14/2022   GFRNONAA >60 08/14/2022   GFRAA 93 10/11/2008    No results found for: "CEA1", "CEA", "[id number]", "CA125"  No results found for: "INR", "LABPROT"  Imaging:  No results found.  Medications: I have reviewed the patient's current medications.   Assessment/Plan: Chronic lymphocytic leukemia Excisional biopsy of right cervical lymph nodes 03/05/2020, kappa restricted B- lymphocytes,  CD20, CD5, CD23, and BCL-2 positive.  FISH panel-trisomy 12, negative for loss of p53 CT neck 02/28/2020-diffuse bilateral cervical and axillary lymphadenopathy Basal cell carcinoma left shoulder region April 2021 Hyperlipidemia COVID-19 infection July 2020 Hypogammaglobulinemia Serum monoclonal IgG kappa protein    Disposition: Mr. Mourning has chronic lymphocytic leukemia.  He is stable from a hematologic standpoint.  He is asymptomatic from the CLL.  There is no indication for treatment at present.  He will return for an office visit and CBC in 6 months.  He will seek medical attention for symptoms of infection.  He will be scheduled for a pneumococcal 20 vaccine in 2026.  Betsy Coder, MD  02/17/2023  11:32 AM

## 2023-02-25 DIAGNOSIS — M13811 Other specified arthritis, right shoulder: Secondary | ICD-10-CM | POA: Diagnosis not present

## 2023-02-25 DIAGNOSIS — Z7184 Encounter for health counseling related to travel: Secondary | ICD-10-CM | POA: Diagnosis not present

## 2023-02-25 DIAGNOSIS — M13812 Other specified arthritis, left shoulder: Secondary | ICD-10-CM | POA: Diagnosis not present

## 2023-03-24 DIAGNOSIS — Z85828 Personal history of other malignant neoplasm of skin: Secondary | ICD-10-CM | POA: Diagnosis not present

## 2023-03-24 DIAGNOSIS — L570 Actinic keratosis: Secondary | ICD-10-CM | POA: Diagnosis not present

## 2023-03-24 DIAGNOSIS — C44529 Squamous cell carcinoma of skin of other part of trunk: Secondary | ICD-10-CM | POA: Diagnosis not present

## 2023-03-24 DIAGNOSIS — L821 Other seborrheic keratosis: Secondary | ICD-10-CM | POA: Diagnosis not present

## 2023-03-24 DIAGNOSIS — C44629 Squamous cell carcinoma of skin of left upper limb, including shoulder: Secondary | ICD-10-CM | POA: Diagnosis not present

## 2023-03-24 DIAGNOSIS — D2261 Melanocytic nevi of right upper limb, including shoulder: Secondary | ICD-10-CM | POA: Diagnosis not present

## 2023-03-24 DIAGNOSIS — L812 Freckles: Secondary | ICD-10-CM | POA: Diagnosis not present

## 2023-07-20 DIAGNOSIS — M13811 Other specified arthritis, right shoulder: Secondary | ICD-10-CM | POA: Diagnosis not present

## 2023-07-20 DIAGNOSIS — C9110 Chronic lymphocytic leukemia of B-cell type not having achieved remission: Secondary | ICD-10-CM | POA: Diagnosis not present

## 2023-07-20 DIAGNOSIS — R590 Localized enlarged lymph nodes: Secondary | ICD-10-CM | POA: Diagnosis not present

## 2023-07-20 DIAGNOSIS — M13812 Other specified arthritis, left shoulder: Secondary | ICD-10-CM | POA: Diagnosis not present

## 2023-08-10 ENCOUNTER — Inpatient Hospital Stay: Payer: BC Managed Care – PPO

## 2023-08-10 ENCOUNTER — Inpatient Hospital Stay: Payer: BC Managed Care – PPO | Admitting: Oncology

## 2023-08-10 ENCOUNTER — Inpatient Hospital Stay: Payer: BC Managed Care – PPO | Attending: Oncology

## 2023-08-10 VITALS — BP 133/85 | HR 76 | Temp 98.1°F | Resp 18 | Ht 73.0 in | Wt 216.0 lb

## 2023-08-10 DIAGNOSIS — C9110 Chronic lymphocytic leukemia of B-cell type not having achieved remission: Secondary | ICD-10-CM

## 2023-08-10 DIAGNOSIS — D801 Nonfamilial hypogammaglobulinemia: Secondary | ICD-10-CM | POA: Diagnosis not present

## 2023-08-10 DIAGNOSIS — Z23 Encounter for immunization: Secondary | ICD-10-CM | POA: Insufficient documentation

## 2023-08-10 DIAGNOSIS — Z85828 Personal history of other malignant neoplasm of skin: Secondary | ICD-10-CM | POA: Insufficient documentation

## 2023-08-10 DIAGNOSIS — Z8616 Personal history of COVID-19: Secondary | ICD-10-CM | POA: Insufficient documentation

## 2023-08-10 LAB — CBC WITH DIFFERENTIAL (CANCER CENTER ONLY)
Abs Immature Granulocytes: 0.01 10*3/uL (ref 0.00–0.07)
Basophils Absolute: 0 10*3/uL (ref 0.0–0.1)
Basophils Relative: 1 %
Eosinophils Absolute: 0.3 10*3/uL (ref 0.0–0.5)
Eosinophils Relative: 4 %
HCT: 46.1 % (ref 39.0–52.0)
Hemoglobin: 15.3 g/dL (ref 13.0–17.0)
Immature Granulocytes: 0 %
Lymphocytes Relative: 22 %
Lymphs Abs: 1.4 10*3/uL (ref 0.7–4.0)
MCH: 29.7 pg (ref 26.0–34.0)
MCHC: 33.2 g/dL (ref 30.0–36.0)
MCV: 89.5 fL (ref 80.0–100.0)
Monocytes Absolute: 0.4 10*3/uL (ref 0.1–1.0)
Monocytes Relative: 7 %
Neutro Abs: 4.2 10*3/uL (ref 1.7–7.7)
Neutrophils Relative %: 66 %
Platelet Count: 156 10*3/uL (ref 150–400)
RBC: 5.15 MIL/uL (ref 4.22–5.81)
RDW: 14.2 % (ref 11.5–15.5)
WBC Count: 6.3 10*3/uL (ref 4.0–10.5)
nRBC: 0 % (ref 0.0–0.2)

## 2023-08-10 MED ORDER — INFLUENZA VIRUS VACC SPLIT PF (FLUZONE) 0.5 ML IM SUSY
0.5000 mL | PREFILLED_SYRINGE | Freq: Once | INTRAMUSCULAR | Status: AC
  Administered 2023-08-10: 0.5 mL via INTRAMUSCULAR
  Filled 2023-08-10: qty 0.5

## 2023-08-10 NOTE — Progress Notes (Signed)
  Jesse [name]   Diagnosis: CLL  INTERVAL HISTORY:   Jesse [name] returns as scheduled.  He feels well.  Good appetite and energy level.  He reports intentional weight loss.  No fever or night sweats.  No recent infection.  He receives steroid injections in the shoulders as needed for arthritis symptoms.  He last received a steroid injection approximately 1 month ago.  Objective:  Vital signs in last 24 hours:  Blood pressure 133/85, pulse 76, temperature 98.1 F (36.7 C), temperature source Oral, resp. rate 18, height 6\' 1"  (1.854 m), weight 216 lb (98 kg), SpO2 100%.     Lymphatics: No cervical, supraclavicular, axillary, or inguinal nodes Resp: Lungs clear bilaterally Cardio: Regular rate and rhythm GI: No hepatosplenomegaly Vascular: No leg edema   Lab Results:  Lab Results  Component Value Date   WBC 6.3 08/10/2023   HGB 15.3 08/10/2023   HCT 46.1 08/10/2023   MCV 89.5 08/10/2023   PLT 156 08/10/2023   NEUTROABS 4.2 08/10/2023    CMP  Lab Results  Component Value Date   NA 139 02/17/2023   K 4.5 02/17/2023   CL 105 02/17/2023   CO2 28 02/17/2023   GLUCOSE 113 (H) 02/17/2023   BUN 15 02/17/2023   CREATININE 1.24 02/17/2023   CALCIUM 9.8 02/17/2023   PROT 6.7 02/17/2023   ALBUMIN 4.8 02/17/2023   AST 14 (L) 02/17/2023   ALT 13 02/17/2023   ALKPHOS 60 02/17/2023   BILITOT 1.1 02/17/2023   GFRNONAA >60 02/17/2023   GFRAA 93 10/11/2008     Medications: I have reviewed the patient's current medications.   Assessment/Plan: Chronic lymphocytic leukemia Excisional biopsy of right cervical lymph nodes 03/05/2020, kappa restricted B- lymphocytes, CD20, CD5, CD23, and BCL-2 positive.  FISH panel-trisomy 12, negative for loss of p53 CT neck 02/28/2020-diffuse bilateral cervical and axillary lymphadenopathy Basal cell carcinoma left shoulder region April 2021 Hyperlipidemia COVID-19 infection July  2020 Hypogammaglobulinemia Serum monoclonal IgG kappa protein     Disposition: Jesse [name] has chronic lymphocytic leukemia.  He appears asymptomatic from the CLL.  He is stable from a hematologic standpoint.  There is no indication for treating the CLL at present.  He received an influenza vaccine today. Jesse [name] would like to continue every 89-month follow-up in the hematology clinic.  He will return for an office visit in 6 months. The palpable lymph nodes noted on exam at presentation have progressed.  This is likely secondary to intermittent steroid therapy.   Thornton Papas, MD  08/10/2023  11:40 AM

## 2023-08-25 ENCOUNTER — Ambulatory Visit: Payer: BC Managed Care – PPO | Admitting: Family Medicine

## 2023-08-28 ENCOUNTER — Encounter: Payer: BC Managed Care – PPO | Admitting: Family Medicine

## 2023-09-08 ENCOUNTER — Encounter: Payer: BC Managed Care – PPO | Admitting: Family Medicine

## 2023-09-14 NOTE — Telephone Encounter (Signed)
Noted  

## 2023-09-14 NOTE — Telephone Encounter (Signed)
Pt never read this message.  Pt was rescheduled for 09/28/23. Pt confirmed he will be here.

## 2023-09-16 ENCOUNTER — Encounter: Payer: BC Managed Care – PPO | Admitting: Family Medicine

## 2023-09-16 ENCOUNTER — Other Ambulatory Visit: Payer: Self-pay | Admitting: Family Medicine

## 2023-09-24 DIAGNOSIS — L570 Actinic keratosis: Secondary | ICD-10-CM | POA: Diagnosis not present

## 2023-09-24 DIAGNOSIS — Z85828 Personal history of other malignant neoplasm of skin: Secondary | ICD-10-CM | POA: Diagnosis not present

## 2023-09-24 DIAGNOSIS — D2261 Melanocytic nevi of right upper limb, including shoulder: Secondary | ICD-10-CM | POA: Diagnosis not present

## 2023-09-24 DIAGNOSIS — D225 Melanocytic nevi of trunk: Secondary | ICD-10-CM | POA: Diagnosis not present

## 2023-09-28 ENCOUNTER — Ambulatory Visit (INDEPENDENT_AMBULATORY_CARE_PROVIDER_SITE_OTHER): Payer: BC Managed Care – PPO | Admitting: Family Medicine

## 2023-09-28 ENCOUNTER — Encounter: Payer: Self-pay | Admitting: Family Medicine

## 2023-09-28 VITALS — BP 110/80 | HR 80 | Temp 97.8°F | Ht 74.0 in | Wt 218.6 lb

## 2023-09-28 DIAGNOSIS — Z0000 Encounter for general adult medical examination without abnormal findings: Secondary | ICD-10-CM

## 2023-09-28 LAB — CBC WITH DIFFERENTIAL/PLATELET
Basophils Absolute: 0 10*3/uL (ref 0.0–0.1)
Basophils Relative: 0.6 % (ref 0.0–3.0)
Eosinophils Absolute: 0.2 10*3/uL (ref 0.0–0.7)
Eosinophils Relative: 3 % (ref 0.0–5.0)
HCT: 45.1 % (ref 39.0–52.0)
Hemoglobin: 15.2 g/dL (ref 13.0–17.0)
Lymphocytes Relative: 19.5 % (ref 12.0–46.0)
Lymphs Abs: 1.5 10*3/uL (ref 0.7–4.0)
MCHC: 33.6 g/dL (ref 30.0–36.0)
MCV: 88.9 fL (ref 78.0–100.0)
Monocytes Absolute: 0.5 10*3/uL (ref 0.1–1.0)
Monocytes Relative: 6.7 % (ref 3.0–12.0)
Neutro Abs: 5.4 10*3/uL (ref 1.4–7.7)
Neutrophils Relative %: 70.2 % (ref 43.0–77.0)
Platelets: 226 10*3/uL (ref 150.0–400.0)
RBC: 5.08 Mil/uL (ref 4.22–5.81)
RDW: 14.1 % (ref 11.5–15.5)
WBC: 7.7 10*3/uL (ref 4.0–10.5)

## 2023-09-28 LAB — BASIC METABOLIC PANEL
BUN: 13 mg/dL (ref 6–23)
CO2: 28 meq/L (ref 19–32)
Calcium: 9.1 mg/dL (ref 8.4–10.5)
Chloride: 105 meq/L (ref 96–112)
Creatinine, Ser: 1.15 mg/dL (ref 0.40–1.50)
GFR: 69.05 mL/min (ref >60.00)
Glucose, Bld: 106 mg/dL — ABNORMAL HIGH (ref 70–99)
Potassium: 4.3 meq/L (ref 3.5–5.1)
Sodium: 140 meq/L (ref 135–145)

## 2023-09-28 LAB — HEPATIC FUNCTION PANEL
ALT: 15 U/L (ref 0–53)
AST: 15 U/L (ref 0–37)
Albumin: 4.3 g/dL (ref 3.5–5.2)
Alkaline Phosphatase: 62 U/L (ref 39–117)
Bilirubin, Direct: 0.1 mg/dL (ref 0.0–0.3)
Total Bilirubin: 0.7 mg/dL (ref 0.2–1.2)
Total Protein: 6.1 g/dL (ref 6.0–8.3)

## 2023-09-28 LAB — LIPID PANEL
Cholesterol: 176 mg/dL (ref 0–200)
HDL: 51.6 mg/dL (ref >39.00)
LDL Cholesterol: 114 mg/dL — ABNORMAL HIGH (ref 0–99)
NonHDL: 124.11
Total CHOL/HDL Ratio: 3
Triglycerides: 51 mg/dL (ref 0.0–149.0)
VLDL: 10.2 mg/dL (ref 0.0–40.0)

## 2023-09-28 LAB — HEMOGLOBIN A1C: Hgb A1c MFr Bld: 5.3 % (ref 4.6–6.5)

## 2023-09-28 LAB — PSA: PSA: 0.41 ng/mL (ref 0.10–4.00)

## 2023-09-28 LAB — TSH: TSH: 2.63 u[IU]/mL (ref 0.35–5.50)

## 2023-09-28 NOTE — Progress Notes (Signed)
Subjective:    Patient ID: Jesse [name], male    DOB: Feb 06, 1961, 62 y.o.   MRN: [id number]  HPI Here for a well exam. He feels great. He sees Dr. Truett Perna twice a year.    Review of Systems  Constitutional: Negative.   HENT: Negative.    Eyes: Negative.   Respiratory: Negative.    Cardiovascular: Negative.   Gastrointestinal: Negative.   Genitourinary: Negative.   Musculoskeletal: Negative.   Skin: Negative.   Neurological: Negative.   Psychiatric/Behavioral: Negative.         Objective:   Physical Exam Constitutional:      General: He is not in acute distress.    Appearance: Normal appearance. He is well-developed. He is not diaphoretic.  HENT:     Head: Normocephalic and atraumatic.     Right Ear: External ear normal.     Left Ear: External ear normal.     Nose: Nose normal.     Mouth/Throat:     Pharynx: No oropharyngeal exudate.  Eyes:     General: No scleral icterus.       Right eye: No discharge.        Left eye: No discharge.     Conjunctiva/sclera: Conjunctivae normal.     Pupils: Pupils are equal, round, and reactive to light.  Neck:     Thyroid: No thyromegaly.     Vascular: No JVD.     Trachea: No tracheal deviation.  Cardiovascular:     Rate and Rhythm: Normal rate and regular rhythm.     Pulses: Normal pulses.     Heart sounds: Normal heart sounds. No murmur heard.    No friction rub. No gallop.  Pulmonary:     Effort: Pulmonary effort is normal. No respiratory distress.     Breath sounds: Normal breath sounds. No wheezing or rales.  Chest:     Chest wall: No tenderness.  Abdominal:     General: Bowel sounds are normal. There is no distension.     Palpations: Abdomen is soft. There is no mass.     Tenderness: There is no abdominal tenderness. There is no guarding or rebound.  Genitourinary:    Penis: Normal. No tenderness.      Testes: Normal.     Prostate: Normal.     Rectum: Normal. Guaiac result negative.  Musculoskeletal:         General: No tenderness. Normal range of motion.     Cervical back: Neck supple.  Lymphadenopathy:     Cervical: No cervical adenopathy.  Skin:    General: Skin is warm and dry.     Coloration: Skin is not pale.     Findings: No erythema or rash.  Neurological:     General: No focal deficit present.     Mental Status: He is alert and oriented to person, place, and time.     Cranial Nerves: No cranial nerve deficit.     Motor: No abnormal muscle tone.     Coordination: Coordination normal.     Deep Tendon Reflexes: Reflexes are normal and symmetric. Reflexes normal.  Psychiatric:        Mood and Affect: Mood normal.        Behavior: Behavior normal.        Thought Content: Thought content normal.        Judgment: Judgment normal.           Assessment & Plan:  Well exam. We discussed diet and  exercise. Get fasting labs. Gershon Crane, MD

## 2023-10-07 ENCOUNTER — Telehealth: Payer: Self-pay | Admitting: Family Medicine

## 2023-10-07 NOTE — Telephone Encounter (Signed)
Patient dropped off document  Medical Advisor's Opinion Form , to be filled out by provider. Patient requested to send it back via Call Patient to pick up within 5-days. Document is located in providers tray at front office.Please advise at Mobile [phone] (mobile)

## 2023-10-09 DIAGNOSIS — Z0279 Encounter for issue of other medical certificate: Secondary | ICD-10-CM

## 2023-10-09 NOTE — Telephone Encounter (Signed)
Pt document received and has been completed by Dr Clent Ridges. Pt notified to pick up

## 2023-10-12 NOTE — Telephone Encounter (Signed)
Left a detailed message for pt to pick up Form at the front desk. Copy of the form sent to scanning

## 2023-11-05 DIAGNOSIS — M13811 Other specified arthritis, right shoulder: Secondary | ICD-10-CM | POA: Diagnosis not present

## 2023-11-05 DIAGNOSIS — M25512 Pain in left shoulder: Secondary | ICD-10-CM | POA: Diagnosis not present

## 2023-12-03 DIAGNOSIS — H2513 Age-related nuclear cataract, bilateral: Secondary | ICD-10-CM | POA: Diagnosis not present

## 2024-02-25 ENCOUNTER — Other Ambulatory Visit: Payer: BC Managed Care – PPO

## 2024-02-25 ENCOUNTER — Inpatient Hospital Stay: Payer: Self-pay

## 2024-02-25 ENCOUNTER — Inpatient Hospital Stay: Payer: BC Managed Care – PPO | Attending: Oncology | Admitting: Oncology

## 2024-02-25 VITALS — BP 124/85 | HR 78 | Temp 98.1°F | Resp 18 | Ht 74.0 in | Wt 217.2 lb

## 2024-02-25 DIAGNOSIS — C9110 Chronic lymphocytic leukemia of B-cell type not having achieved remission: Secondary | ICD-10-CM | POA: Diagnosis not present

## 2024-02-25 DIAGNOSIS — Z8616 Personal history of COVID-19: Secondary | ICD-10-CM | POA: Insufficient documentation

## 2024-02-25 DIAGNOSIS — E785 Hyperlipidemia, unspecified: Secondary | ICD-10-CM | POA: Insufficient documentation

## 2024-02-25 DIAGNOSIS — D801 Nonfamilial hypogammaglobulinemia: Secondary | ICD-10-CM | POA: Diagnosis not present

## 2024-02-25 DIAGNOSIS — Z85828 Personal history of other malignant neoplasm of skin: Secondary | ICD-10-CM | POA: Diagnosis not present

## 2024-02-25 LAB — CBC WITH DIFFERENTIAL (CANCER CENTER ONLY)
Abs Immature Granulocytes: 0.01 10*3/uL (ref 0.00–0.07)
Basophils Absolute: 0 10*3/uL (ref 0.0–0.1)
Basophils Relative: 0 %
Eosinophils Absolute: 0.1 10*3/uL (ref 0.0–0.5)
Eosinophils Relative: 2 %
HCT: 42.9 % (ref 39.0–52.0)
Hemoglobin: 14.5 g/dL (ref 13.0–17.0)
Immature Granulocytes: 0 %
Lymphocytes Relative: 24 %
Lymphs Abs: 1.6 10*3/uL (ref 0.7–4.0)
MCH: 30 pg (ref 26.0–34.0)
MCHC: 33.8 g/dL (ref 30.0–36.0)
MCV: 88.6 fL (ref 80.0–100.0)
Monocytes Absolute: 0.5 10*3/uL (ref 0.1–1.0)
Monocytes Relative: 7 %
Neutro Abs: 4.5 10*3/uL (ref 1.7–7.7)
Neutrophils Relative %: 67 %
Platelet Count: 191 10*3/uL (ref 150–400)
RBC: 4.84 MIL/uL (ref 4.22–5.81)
RDW: 13.7 % (ref 11.5–15.5)
WBC Count: 6.8 10*3/uL (ref 4.0–10.5)
nRBC: 0 % (ref 0.0–0.2)

## 2024-02-25 NOTE — Progress Notes (Signed)
  [facility] OFFICE PROGRESS NOTE   Diagnosis: CLL  INTERVAL HISTORY:   Mr. Hilyard returns as scheduled.  He feels well.  Good appetite and energy level.  No fever.  No recent infection.  He is traveling and exercising.  No palpable lymph nodes.  No recent steroid injection.  Objective:  Vital signs in last 24 hours:  Blood pressure 124/85, pulse 78, temperature 98.1 F (36.7 C), temperature source Temporal, resp. rate 18, height 6\' 2"  (1.88 m), weight 217 lb 3.2 oz (98.5 kg), SpO2 98%.    Lymphatics: Cervical, supraclavicular, axillary, or inguinal nodes Resp: Lungs clear bilaterally Cardio: Regular rate and rhythm GI: No hepatosplenomegaly Vascular: No leg edema   Lab Results:  Lab Results  Component Value Date   WBC 6.8 02/25/2024   HGB 14.5 02/25/2024   HCT 42.9 02/25/2024   MCV 88.6 02/25/2024   PLT 191 02/25/2024   NEUTROABS 4.5 02/25/2024    CMP  Lab Results  Component Value Date   NA 140 09/28/2023   K 4.3 09/28/2023   CL 105 09/28/2023   CO2 28 09/28/2023   GLUCOSE 106 (H) 09/28/2023   BUN 13 09/28/2023   CREATININE 1.15 09/28/2023   CALCIUM 9.1 09/28/2023   PROT 6.1 09/28/2023   ALBUMIN 4.3 09/28/2023   AST 15 09/28/2023   ALT 15 09/28/2023   ALKPHOS 62 09/28/2023   BILITOT 0.7 09/28/2023   GFRNONAA >60 02/17/2023   GFRAA 93 10/11/2008    No results found for: "CEA1", "CEA", "ZOX096", "CA125"  No results found for: "INR", "LABPROT"  Imaging:  No results found.  Medications: I have reviewed the patient's current medications.   Assessment/Plan:  Chronic lymphocytic leukemia Excisional biopsy of right cervical lymph nodes 03/05/2020, kappa restricted B- lymphocytes, CD20, CD5, CD23, and BCL-2 positive.  FISH panel-trisomy 12, negative for loss of p53 CT neck 02/28/2020-diffuse bilateral cervical and axillary lymphadenopathy Basal cell carcinoma left shoulder region April 2021 Hyperlipidemia COVID-19 infection July  2020 Hypogammaglobulinemia Serum monoclonal IgG kappa protein    Disposition: Mr. Shifflett appears stable.  There is no clinical evidence for progression of the CLL.  He will return for an office and lab visit in 6 months.  He will call in the interim for new symptoms.  Thornton Papas, MD  02/25/2024  12:21 PM

## 2024-03-01 ENCOUNTER — Telehealth: Payer: Self-pay | Admitting: Oncology

## 2024-03-01 NOTE — Telephone Encounter (Signed)
 Contacted pt to schedule an appt per 02/25/24 LOS. Unable to reach via phone, voicemail was left.

## 2024-03-04 NOTE — Telephone Encounter (Signed)
 Patient has been scheduled. Aware of appt date and time.

## 2024-03-23 DIAGNOSIS — L570 Actinic keratosis: Secondary | ICD-10-CM | POA: Diagnosis not present

## 2024-03-23 DIAGNOSIS — Z85828 Personal history of other malignant neoplasm of skin: Secondary | ICD-10-CM | POA: Diagnosis not present

## 2024-03-23 DIAGNOSIS — D2261 Melanocytic nevi of right upper limb, including shoulder: Secondary | ICD-10-CM | POA: Diagnosis not present

## 2024-03-23 DIAGNOSIS — C44519 Basal cell carcinoma of skin of other part of trunk: Secondary | ICD-10-CM | POA: Diagnosis not present

## 2024-03-23 DIAGNOSIS — L812 Freckles: Secondary | ICD-10-CM | POA: Diagnosis not present

## 2024-03-23 DIAGNOSIS — D2339 Other benign neoplasm of skin of other parts of face: Secondary | ICD-10-CM | POA: Diagnosis not present

## 2024-03-23 DIAGNOSIS — L718 Other rosacea: Secondary | ICD-10-CM | POA: Diagnosis not present

## 2024-04-11 DIAGNOSIS — C9110 Chronic lymphocytic leukemia of B-cell type not having achieved remission: Secondary | ICD-10-CM | POA: Diagnosis not present

## 2024-04-11 DIAGNOSIS — M13811 Other specified arthritis, right shoulder: Secondary | ICD-10-CM | POA: Diagnosis not present

## 2024-04-11 DIAGNOSIS — M25511 Pain in right shoulder: Secondary | ICD-10-CM | POA: Diagnosis not present

## 2024-04-11 DIAGNOSIS — M25512 Pain in left shoulder: Secondary | ICD-10-CM | POA: Diagnosis not present

## 2024-08-26 ENCOUNTER — Other Ambulatory Visit

## 2024-08-26 ENCOUNTER — Ambulatory Visit: Admitting: Oncology

## 2024-09-15 ENCOUNTER — Ambulatory Visit: Admitting: Oncology

## 2024-09-15 ENCOUNTER — Other Ambulatory Visit: Payer: Self-pay | Admitting: Family Medicine

## 2024-09-15 ENCOUNTER — Other Ambulatory Visit

## 2024-09-20 ENCOUNTER — Encounter: Payer: Self-pay | Admitting: Oncology

## 2024-09-20 ENCOUNTER — Inpatient Hospital Stay: Admitting: Oncology

## 2024-09-20 ENCOUNTER — Inpatient Hospital Stay: Attending: Oncology

## 2024-09-20 ENCOUNTER — Inpatient Hospital Stay

## 2024-09-20 VITALS — BP 126/88 | HR 79 | Temp 98.1°F | Resp 18 | Ht 74.0 in | Wt 213.3 lb

## 2024-09-20 DIAGNOSIS — D801 Nonfamilial hypogammaglobulinemia: Secondary | ICD-10-CM | POA: Diagnosis not present

## 2024-09-20 DIAGNOSIS — C9110 Chronic lymphocytic leukemia of B-cell type not having achieved remission: Secondary | ICD-10-CM | POA: Insufficient documentation

## 2024-09-20 DIAGNOSIS — Z23 Encounter for immunization: Secondary | ICD-10-CM

## 2024-09-20 LAB — CBC WITH DIFFERENTIAL (CANCER CENTER ONLY)
Abs Immature Granulocytes: 0.01 K/uL (ref 0.00–0.07)
Basophils Absolute: 0.1 K/uL (ref 0.0–0.1)
Basophils Relative: 1 %
Eosinophils Absolute: 0.2 K/uL (ref 0.0–0.5)
Eosinophils Relative: 3 %
HCT: 45.1 % (ref 39.0–52.0)
Hemoglobin: 15.3 g/dL (ref 13.0–17.0)
Immature Granulocytes: 0 %
Lymphocytes Relative: 21 %
Lymphs Abs: 1.4 K/uL (ref 0.7–4.0)
MCH: 29.8 pg (ref 26.0–34.0)
MCHC: 33.9 g/dL (ref 30.0–36.0)
MCV: 87.7 fL (ref 80.0–100.0)
Monocytes Absolute: 0.4 K/uL (ref 0.1–1.0)
Monocytes Relative: 6 %
Neutro Abs: 4.5 K/uL (ref 1.7–7.7)
Neutrophils Relative %: 69 %
Platelet Count: 180 K/uL (ref 150–400)
RBC: 5.14 MIL/uL (ref 4.22–5.81)
RDW: 13.8 % (ref 11.5–15.5)
WBC Count: 6.5 K/uL (ref 4.0–10.5)
nRBC: 0 % (ref 0.0–0.2)

## 2024-09-20 MED ORDER — INFLUENZA VIRUS VACC SPLIT PF (FLUZONE) 0.5 ML IM SUSY
0.5000 mL | PREFILLED_SYRINGE | Freq: Once | INTRAMUSCULAR | Status: AC
  Administered 2024-09-20: 0.5 mL via INTRAMUSCULAR
  Filled 2024-09-20: qty 0.5

## 2024-09-20 NOTE — Progress Notes (Signed)
  [facility] OFFICE PROGRESS NOTE   Diagnosis: CLL  INTERVAL HISTORY:   Jesse [name] returns as scheduled.  He feels well.  No fever or night sweats.  Good appetite.  He is exercising.  No recent infection.  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  Objective:  Vital signs in last 24 hours:  Blood pressure 126/88, pulse 79, temperature 98.1 F (36.7 C), temperature source Temporal, resp. rate 18, height 6' 2 (1.88 m), weight 213 lb 4.8 oz (96.8 kg), SpO2 96%.    Lymphatics: No cervical, supraclavicular, or inguinal nodes.  Soft 1 cm mobile bilateral axillary nodes versus prominent fat pads Resp: Lungs clear bilaterally Cardio: Regular rate and rhythm GI: No hepatosplenomegaly Vascular: No leg edema  Lab Results:  Lab Results  Component Value Date   WBC 6.5 09/20/2024   HGB 15.3 09/20/2024   HCT 45.1 09/20/2024   MCV 87.7 09/20/2024   PLT 180 09/20/2024   NEUTROABS 4.5 09/20/2024    CMP  Lab Results  Component Value Date   NA 140 09/28/2023   K 4.3 09/28/2023   CL 105 09/28/2023   CO2 28 09/28/2023   GLUCOSE 106 (H) 09/28/2023   BUN 13 09/28/2023   CREATININE 1.15 09/28/2023   CALCIUM 9.1 09/28/2023   PROT 6.1 09/28/2023   ALBUMIN 4.3 09/28/2023   AST 15 09/28/2023   ALT 15 09/28/2023   ALKPHOS 62 09/28/2023   BILITOT 0.7 09/28/2023   GFRNONAA >60 02/17/2023   GFRAA 93 10/11/2008     Medications: I have reviewed the patient's current medications.   Assessment/Plan: Chronic lymphocytic leukemia Excisional biopsy of right cervical lymph nodes 03/05/2020, kappa restricted B- lymphocytes, CD20, CD5, CD23, and BCL-2 positive.  FISH panel-trisomy 12, negative for loss of p53 CT neck 02/28/2020-diffuse bilateral cervical and axillary lymphadenopathy Basal cell carcinoma left shoulder region April 2021 Hyperlipidemia COVID-19 infection July 2020 Hypogammaglobulinemia Serum  monoclonal IgG kappa protein     Disposition: Mr. Nin appears stable.  He is stable from a hematologic standpoint and asymptomatic from the CLL.  The plan is to continue observation.  He would like to continue every 11-month follow-up in the oncology clinic.  He received an influenza vaccine today.  He plans to obtain a pneumococcal 20 or 21 vaccine in 2026.  He will seek medical attention for symptoms of an infection.  Arley Hof, MD  09/20/2024  8:19 AM

## 2024-09-27 ENCOUNTER — Encounter: Payer: Self-pay | Admitting: Oncology

## 2024-12-20 ENCOUNTER — Encounter: Admitting: Family Medicine

## 2025-01-04 ENCOUNTER — Encounter: Admitting: Family Medicine

## 2025-03-21 ENCOUNTER — Inpatient Hospital Stay: Admitting: Oncology

## 2025-03-21 ENCOUNTER — Inpatient Hospital Stay
